# Patient Record
Sex: Female | Born: 1975 | Race: White | Hispanic: No | Smoking: Never smoker
Health system: Southern US, Community
[De-identification: ages and names within clinical notes are randomized; demographics above are authoritative.]

## PROBLEM LIST (undated history)

## (undated) DIAGNOSIS — F32A Depression, unspecified: Secondary | ICD-10-CM

## (undated) HISTORY — PX: WISDOM TOOTH EXTRACTION: SHX21

## (undated) HISTORY — DX: Depression, unspecified: F32.A

---

## 1998-07-18 ENCOUNTER — Encounter: Admission: RE | Admit: 1998-07-18 | Discharge: 1998-07-18 | Payer: Self-pay | Admitting: Family Medicine

## 1998-07-25 ENCOUNTER — Encounter: Admission: RE | Admit: 1998-07-25 | Discharge: 1998-07-25 | Payer: Self-pay | Admitting: Sports Medicine

## 1998-10-08 ENCOUNTER — Emergency Department (HOSPITAL_COMMUNITY): Admission: EM | Admit: 1998-10-08 | Discharge: 1998-10-08 | Payer: Self-pay | Admitting: Emergency Medicine

## 1999-03-06 ENCOUNTER — Emergency Department (HOSPITAL_COMMUNITY): Admission: EM | Admit: 1999-03-06 | Discharge: 1999-03-06 | Payer: Self-pay | Admitting: Emergency Medicine

## 1999-03-28 ENCOUNTER — Encounter: Admission: RE | Admit: 1999-03-28 | Discharge: 1999-03-28 | Payer: Self-pay | Admitting: Sports Medicine

## 1999-03-28 ENCOUNTER — Other Ambulatory Visit: Admission: RE | Admit: 1999-03-28 | Discharge: 1999-03-28 | Payer: Self-pay | Admitting: *Deleted

## 1999-09-15 ENCOUNTER — Encounter: Admission: RE | Admit: 1999-09-15 | Discharge: 1999-09-15 | Payer: Self-pay | Admitting: Family Medicine

## 1999-10-25 ENCOUNTER — Encounter: Payer: Self-pay | Admitting: *Deleted

## 1999-10-25 ENCOUNTER — Emergency Department (HOSPITAL_COMMUNITY): Admission: EM | Admit: 1999-10-25 | Discharge: 1999-10-25 | Payer: Self-pay | Admitting: Emergency Medicine

## 1999-10-27 ENCOUNTER — Encounter: Admission: RE | Admit: 1999-10-27 | Discharge: 1999-10-27 | Payer: Self-pay | Admitting: Family Medicine

## 1999-11-16 ENCOUNTER — Encounter: Admission: RE | Admit: 1999-11-16 | Discharge: 1999-11-16 | Payer: Self-pay | Admitting: Family Medicine

## 2000-03-12 ENCOUNTER — Emergency Department (HOSPITAL_COMMUNITY): Admission: EM | Admit: 2000-03-12 | Discharge: 2000-03-12 | Payer: Self-pay | Admitting: Emergency Medicine

## 2000-05-30 ENCOUNTER — Encounter: Admission: RE | Admit: 2000-05-30 | Discharge: 2000-05-30 | Payer: Self-pay

## 2000-05-30 ENCOUNTER — Encounter: Admission: RE | Admit: 2000-05-30 | Discharge: 2000-05-30 | Payer: Self-pay | Admitting: Family Medicine

## 2001-01-30 ENCOUNTER — Emergency Department (HOSPITAL_COMMUNITY): Admission: EM | Admit: 2001-01-30 | Discharge: 2001-01-31 | Payer: Self-pay | Admitting: Emergency Medicine

## 2001-01-31 ENCOUNTER — Encounter: Payer: Self-pay | Admitting: Emergency Medicine

## 2001-08-21 ENCOUNTER — Emergency Department (HOSPITAL_COMMUNITY): Admission: EM | Admit: 2001-08-21 | Discharge: 2001-08-21 | Payer: Self-pay | Admitting: Emergency Medicine

## 2001-11-07 ENCOUNTER — Emergency Department (HOSPITAL_COMMUNITY): Admission: EM | Admit: 2001-11-07 | Discharge: 2001-11-07 | Payer: Self-pay

## 2001-11-14 ENCOUNTER — Encounter: Admission: RE | Admit: 2001-11-14 | Discharge: 2001-11-14 | Payer: Self-pay | Admitting: Family Medicine

## 2001-11-26 ENCOUNTER — Encounter: Admission: RE | Admit: 2001-11-26 | Discharge: 2001-11-26 | Payer: Self-pay | Admitting: Family Medicine

## 2001-12-12 ENCOUNTER — Encounter: Admission: RE | Admit: 2001-12-12 | Discharge: 2001-12-12 | Payer: Self-pay | Admitting: Family Medicine

## 2002-01-12 ENCOUNTER — Encounter: Admission: RE | Admit: 2002-01-12 | Discharge: 2002-01-12 | Payer: Self-pay | Admitting: Sports Medicine

## 2002-02-10 ENCOUNTER — Encounter: Admission: RE | Admit: 2002-02-10 | Discharge: 2002-02-10 | Payer: Self-pay | Admitting: Family Medicine

## 2002-02-16 ENCOUNTER — Ambulatory Visit (HOSPITAL_COMMUNITY): Admission: RE | Admit: 2002-02-16 | Discharge: 2002-02-16 | Payer: Self-pay | Admitting: Family Medicine

## 2002-03-12 ENCOUNTER — Encounter: Admission: RE | Admit: 2002-03-12 | Discharge: 2002-03-12 | Payer: Self-pay | Admitting: Family Medicine

## 2002-03-16 ENCOUNTER — Encounter: Admission: RE | Admit: 2002-03-16 | Discharge: 2002-03-16 | Payer: Self-pay | Admitting: Nephrology

## 2002-04-13 ENCOUNTER — Encounter: Admission: RE | Admit: 2002-04-13 | Discharge: 2002-04-13 | Payer: Self-pay | Admitting: Family Medicine

## 2002-04-20 ENCOUNTER — Encounter: Admission: RE | Admit: 2002-04-20 | Discharge: 2002-04-20 | Payer: Self-pay | Admitting: Family Medicine

## 2002-04-29 ENCOUNTER — Encounter: Admission: RE | Admit: 2002-04-29 | Discharge: 2002-04-29 | Payer: Self-pay | Admitting: Family Medicine

## 2002-05-07 ENCOUNTER — Ambulatory Visit (HOSPITAL_COMMUNITY): Admission: RE | Admit: 2002-05-07 | Discharge: 2002-05-07 | Payer: Self-pay | Admitting: Family Medicine

## 2002-05-09 ENCOUNTER — Inpatient Hospital Stay (HOSPITAL_COMMUNITY): Admission: AD | Admit: 2002-05-09 | Discharge: 2002-05-09 | Payer: Self-pay | Admitting: *Deleted

## 2002-05-13 ENCOUNTER — Encounter: Admission: RE | Admit: 2002-05-13 | Discharge: 2002-05-13 | Payer: Self-pay | Admitting: Family Medicine

## 2002-05-29 ENCOUNTER — Encounter: Admission: RE | Admit: 2002-05-29 | Discharge: 2002-05-29 | Payer: Self-pay | Admitting: Family Medicine

## 2002-06-11 ENCOUNTER — Encounter: Admission: RE | Admit: 2002-06-11 | Discharge: 2002-06-11 | Payer: Self-pay | Admitting: Family Medicine

## 2002-06-14 ENCOUNTER — Inpatient Hospital Stay (HOSPITAL_COMMUNITY): Admission: AD | Admit: 2002-06-14 | Discharge: 2002-06-14 | Payer: Self-pay | Admitting: Obstetrics and Gynecology

## 2002-06-16 ENCOUNTER — Encounter: Admission: RE | Admit: 2002-06-16 | Discharge: 2002-06-16 | Payer: Self-pay | Admitting: Family Medicine

## 2002-06-18 ENCOUNTER — Inpatient Hospital Stay (HOSPITAL_COMMUNITY): Admission: RE | Admit: 2002-06-18 | Discharge: 2002-06-18 | Payer: Self-pay | Admitting: *Deleted

## 2002-06-19 ENCOUNTER — Inpatient Hospital Stay (HOSPITAL_COMMUNITY): Admission: AD | Admit: 2002-06-19 | Discharge: 2002-06-21 | Payer: Self-pay | Admitting: Obstetrics and Gynecology

## 2002-07-31 ENCOUNTER — Encounter: Admission: RE | Admit: 2002-07-31 | Discharge: 2002-07-31 | Payer: Self-pay | Admitting: Family Medicine

## 2002-07-31 ENCOUNTER — Other Ambulatory Visit: Admission: RE | Admit: 2002-07-31 | Discharge: 2002-07-31 | Payer: Self-pay | Admitting: Family Medicine

## 2002-08-26 ENCOUNTER — Encounter: Admission: RE | Admit: 2002-08-26 | Discharge: 2002-08-26 | Payer: Self-pay | Admitting: Family Medicine

## 2002-09-01 ENCOUNTER — Encounter: Admission: RE | Admit: 2002-09-01 | Discharge: 2002-09-01 | Payer: Self-pay | Admitting: Family Medicine

## 2002-10-12 ENCOUNTER — Encounter: Admission: RE | Admit: 2002-10-12 | Discharge: 2002-10-12 | Payer: Self-pay | Admitting: Sports Medicine

## 2002-11-18 ENCOUNTER — Encounter: Admission: RE | Admit: 2002-11-18 | Discharge: 2002-11-18 | Payer: Self-pay | Admitting: Family Medicine

## 2003-02-19 ENCOUNTER — Encounter: Admission: RE | Admit: 2003-02-19 | Discharge: 2003-02-19 | Payer: Self-pay | Admitting: Family Medicine

## 2003-04-15 ENCOUNTER — Encounter: Admission: RE | Admit: 2003-04-15 | Discharge: 2003-04-15 | Payer: Self-pay | Admitting: Family Medicine

## 2004-03-29 ENCOUNTER — Encounter: Admission: RE | Admit: 2004-03-29 | Discharge: 2004-03-29 | Payer: Self-pay | Admitting: Family Medicine

## 2004-03-29 ENCOUNTER — Other Ambulatory Visit: Admission: RE | Admit: 2004-03-29 | Discharge: 2004-03-29 | Payer: Self-pay | Admitting: Family Medicine

## 2005-07-11 ENCOUNTER — Ambulatory Visit: Payer: Self-pay | Admitting: Sports Medicine

## 2006-01-25 ENCOUNTER — Ambulatory Visit: Payer: Self-pay | Admitting: Sports Medicine

## 2006-03-05 ENCOUNTER — Ambulatory Visit: Payer: Self-pay | Admitting: Family Medicine

## 2006-04-29 ENCOUNTER — Ambulatory Visit: Payer: Self-pay | Admitting: Sports Medicine

## 2006-05-14 ENCOUNTER — Ambulatory Visit: Payer: Self-pay | Admitting: Family Medicine

## 2006-05-16 ENCOUNTER — Ambulatory Visit: Payer: Self-pay | Admitting: Family Medicine

## 2006-06-09 ENCOUNTER — Encounter (INDEPENDENT_AMBULATORY_CARE_PROVIDER_SITE_OTHER): Payer: Self-pay | Admitting: *Deleted

## 2006-06-09 LAB — CONVERTED CEMR LAB

## 2006-06-10 ENCOUNTER — Ambulatory Visit: Payer: Self-pay | Admitting: Family Medicine

## 2006-06-25 ENCOUNTER — Ambulatory Visit: Payer: Self-pay | Admitting: Family Medicine

## 2006-07-24 ENCOUNTER — Ambulatory Visit: Payer: Self-pay | Admitting: Family Medicine

## 2006-10-08 ENCOUNTER — Ambulatory Visit: Payer: Self-pay | Admitting: Family Medicine

## 2006-11-07 ENCOUNTER — Ambulatory Visit: Payer: Self-pay | Admitting: Family Medicine

## 2006-11-25 ENCOUNTER — Emergency Department (HOSPITAL_COMMUNITY): Admission: EM | Admit: 2006-11-25 | Discharge: 2006-11-25 | Payer: Self-pay | Admitting: Emergency Medicine

## 2007-02-06 DIAGNOSIS — J309 Allergic rhinitis, unspecified: Secondary | ICD-10-CM | POA: Insufficient documentation

## 2007-02-06 DIAGNOSIS — G43909 Migraine, unspecified, not intractable, without status migrainosus: Secondary | ICD-10-CM | POA: Insufficient documentation

## 2007-02-07 ENCOUNTER — Encounter (INDEPENDENT_AMBULATORY_CARE_PROVIDER_SITE_OTHER): Payer: Self-pay | Admitting: *Deleted

## 2007-04-23 ENCOUNTER — Encounter: Payer: Self-pay | Admitting: *Deleted

## 2007-04-23 ENCOUNTER — Telehealth: Payer: Self-pay | Admitting: *Deleted

## 2007-05-02 ENCOUNTER — Ambulatory Visit: Payer: Self-pay | Admitting: Family Medicine

## 2007-05-02 ENCOUNTER — Encounter: Payer: Self-pay | Admitting: Family Medicine

## 2007-05-02 DIAGNOSIS — M25569 Pain in unspecified knee: Secondary | ICD-10-CM | POA: Insufficient documentation

## 2007-05-02 LAB — CONVERTED CEMR LAB
HCT: 39.1 %
MCV: 93.3 fL
RBC: 4.19 M/uL
WBC: 7.3 10*3/uL

## 2007-05-28 ENCOUNTER — Encounter: Payer: Self-pay | Admitting: Family Medicine

## 2007-05-28 LAB — CONVERTED CEMR LAB
AST: 14 units/L (ref 0–37)
Alkaline Phosphatase: 66 units/L (ref 39–117)
BUN: 11 mg/dL (ref 6–23)
Glucose, Bld: 76 mg/dL (ref 70–99)
HDL: 52 mg/dL (ref >39)
LDL Cholesterol: 75 mg/dL (ref 0–99)
Total Bilirubin: 0.6 mg/dL (ref 0.3–1.2)
Total CHOL/HDL Ratio: 2.8
Triglycerides: 93 mg/dL (ref <150)
VLDL: 19 mg/dL (ref 0–40)

## 2007-06-04 ENCOUNTER — Encounter: Payer: Self-pay | Admitting: Family Medicine

## 2007-06-04 ENCOUNTER — Ambulatory Visit: Payer: Self-pay | Admitting: Family Medicine

## 2007-06-04 LAB — CONVERTED CEMR LAB
Basophils Absolute: 0.2 10*3/uL
Granulocyte percent: 62.7 %
HCT: 41.9 %
Hemoglobin: 14.4 g/dL
Lymphocytes Relative: 32.8 %
Monocytes Relative: 4.5 %
Nitrite: NEGATIVE
Platelets: 212 10*3/uL
Urobilinogen, UA: 1
WBC Urine, dipstick: NEGATIVE

## 2007-06-20 ENCOUNTER — Telehealth (INDEPENDENT_AMBULATORY_CARE_PROVIDER_SITE_OTHER): Payer: Self-pay | Admitting: *Deleted

## 2007-06-23 ENCOUNTER — Telehealth (INDEPENDENT_AMBULATORY_CARE_PROVIDER_SITE_OTHER): Payer: Self-pay | Admitting: *Deleted

## 2007-10-16 ENCOUNTER — Encounter: Payer: Self-pay | Admitting: Family Medicine

## 2007-10-16 ENCOUNTER — Ambulatory Visit: Payer: Self-pay | Admitting: Family Medicine

## 2007-10-16 ENCOUNTER — Other Ambulatory Visit: Admission: RE | Admit: 2007-10-16 | Discharge: 2007-10-16 | Payer: Self-pay | Admitting: Family Medicine

## 2007-10-16 DIAGNOSIS — F4321 Adjustment disorder with depressed mood: Secondary | ICD-10-CM | POA: Insufficient documentation

## 2007-10-27 ENCOUNTER — Encounter: Payer: Self-pay | Admitting: Family Medicine

## 2007-10-27 ENCOUNTER — Ambulatory Visit: Payer: Self-pay | Admitting: Family Medicine

## 2007-11-17 ENCOUNTER — Telehealth: Payer: Self-pay | Admitting: *Deleted

## 2007-11-25 ENCOUNTER — Telehealth: Payer: Self-pay | Admitting: Family Medicine

## 2007-12-08 ENCOUNTER — Ambulatory Visit: Payer: Self-pay | Admitting: Sports Medicine

## 2007-12-08 LAB — CONVERTED CEMR LAB: Rapid Strep: NEGATIVE

## 2008-01-21 ENCOUNTER — Ambulatory Visit: Payer: Self-pay | Admitting: Family Medicine

## 2008-01-21 DIAGNOSIS — F329 Major depressive disorder, single episode, unspecified: Secondary | ICD-10-CM

## 2008-01-21 DIAGNOSIS — F419 Anxiety disorder, unspecified: Secondary | ICD-10-CM

## 2008-01-21 DIAGNOSIS — F32A Depression, unspecified: Secondary | ICD-10-CM | POA: Insufficient documentation

## 2008-02-10 ENCOUNTER — Ambulatory Visit: Payer: Self-pay | Admitting: Family Medicine

## 2008-02-16 ENCOUNTER — Ambulatory Visit: Payer: Self-pay | Admitting: Family Medicine

## 2008-02-16 ENCOUNTER — Encounter (INDEPENDENT_AMBULATORY_CARE_PROVIDER_SITE_OTHER): Payer: Self-pay | Admitting: Family Medicine

## 2008-02-19 ENCOUNTER — Telehealth: Payer: Self-pay | Admitting: *Deleted

## 2008-02-20 ENCOUNTER — Encounter: Payer: Self-pay | Admitting: *Deleted

## 2008-03-09 ENCOUNTER — Encounter (INDEPENDENT_AMBULATORY_CARE_PROVIDER_SITE_OTHER): Payer: Self-pay | Admitting: Family Medicine

## 2008-03-09 ENCOUNTER — Ambulatory Visit: Payer: Self-pay | Admitting: Family Medicine

## 2008-03-16 ENCOUNTER — Encounter: Payer: Self-pay | Admitting: Family Medicine

## 2008-03-16 ENCOUNTER — Ambulatory Visit: Payer: Self-pay | Admitting: Family Medicine

## 2008-03-16 LAB — CONVERTED CEMR LAB
Chlamydia, DNA Probe: NEGATIVE
GC Probe Amp, Genital: NEGATIVE

## 2008-03-17 ENCOUNTER — Telehealth (INDEPENDENT_AMBULATORY_CARE_PROVIDER_SITE_OTHER): Payer: Self-pay | Admitting: Family Medicine

## 2008-04-07 ENCOUNTER — Emergency Department (HOSPITAL_COMMUNITY): Admission: EM | Admit: 2008-04-07 | Discharge: 2008-04-08 | Payer: Self-pay | Admitting: Emergency Medicine

## 2008-04-15 ENCOUNTER — Encounter: Payer: Self-pay | Admitting: *Deleted

## 2008-05-28 ENCOUNTER — Encounter: Payer: Self-pay | Admitting: Family Medicine

## 2008-05-28 ENCOUNTER — Telehealth: Payer: Self-pay | Admitting: *Deleted

## 2008-05-28 ENCOUNTER — Ambulatory Visit: Payer: Self-pay | Admitting: Family Medicine

## 2008-05-28 LAB — CONVERTED CEMR LAB: GC Probe Amp, Genital: NEGATIVE

## 2008-07-06 ENCOUNTER — Ambulatory Visit: Payer: Self-pay | Admitting: Family Medicine

## 2008-07-27 ENCOUNTER — Encounter: Payer: Self-pay | Admitting: Family Medicine

## 2008-07-27 ENCOUNTER — Ambulatory Visit: Payer: Self-pay | Admitting: Family Medicine

## 2008-07-27 DIAGNOSIS — N643 Galactorrhea not associated with childbirth: Secondary | ICD-10-CM | POA: Insufficient documentation

## 2008-07-27 LAB — CONVERTED CEMR LAB
Chlamydia, DNA Probe: NEGATIVE
GC Probe Amp, Genital: NEGATIVE
Hemoglobin: 14.3 g/dL (ref 12.0–15.0)
MCHC: 33 g/dL (ref 30.0–36.0)
Prolactin: 7.3 ng/mL
RBC: 4.51 M/uL (ref 3.87–5.11)

## 2008-08-04 ENCOUNTER — Encounter (INDEPENDENT_AMBULATORY_CARE_PROVIDER_SITE_OTHER): Payer: Self-pay | Admitting: *Deleted

## 2008-08-06 ENCOUNTER — Encounter: Payer: Self-pay | Admitting: *Deleted

## 2008-08-11 ENCOUNTER — Encounter: Payer: Self-pay | Admitting: *Deleted

## 2008-08-20 ENCOUNTER — Telehealth: Payer: Self-pay | Admitting: *Deleted

## 2008-08-20 ENCOUNTER — Encounter: Admission: RE | Admit: 2008-08-20 | Discharge: 2008-08-20 | Payer: Self-pay | Admitting: Family Medicine

## 2008-09-02 ENCOUNTER — Ambulatory Visit: Payer: Self-pay | Admitting: Family Medicine

## 2008-09-02 ENCOUNTER — Telehealth: Payer: Self-pay | Admitting: *Deleted

## 2008-09-02 LAB — CONVERTED CEMR LAB
Blood in Urine, dipstick: NEGATIVE
Glucose, Urine, Semiquant: NEGATIVE
Specific Gravity, Urine: 1.025
pH: 6.5

## 2008-09-03 ENCOUNTER — Encounter: Payer: Self-pay | Admitting: Family Medicine

## 2008-09-13 ENCOUNTER — Telehealth: Payer: Self-pay | Admitting: *Deleted

## 2008-09-13 ENCOUNTER — Encounter: Payer: Self-pay | Admitting: *Deleted

## 2009-11-09 ENCOUNTER — Emergency Department (HOSPITAL_COMMUNITY): Admission: EM | Admit: 2009-11-09 | Discharge: 2009-11-09 | Payer: Self-pay | Admitting: Family Medicine

## 2009-11-14 ENCOUNTER — Emergency Department (HOSPITAL_COMMUNITY): Admission: EM | Admit: 2009-11-14 | Discharge: 2009-11-14 | Payer: Self-pay | Admitting: Family Medicine

## 2010-07-19 ENCOUNTER — Ambulatory Visit: Payer: Self-pay | Admitting: Family Medicine

## 2010-07-19 ENCOUNTER — Encounter: Payer: Self-pay | Admitting: Family Medicine

## 2010-07-19 LAB — CONVERTED CEMR LAB
Chlamydia, DNA Probe: NEGATIVE
GC Probe Amp, Genital: NEGATIVE

## 2010-08-17 ENCOUNTER — Ambulatory Visit: Payer: Self-pay | Admitting: Family Medicine

## 2010-09-04 ENCOUNTER — Telehealth: Payer: Self-pay | Admitting: *Deleted

## 2010-09-04 ENCOUNTER — Emergency Department (HOSPITAL_COMMUNITY): Admission: EM | Admit: 2010-09-04 | Discharge: 2010-09-04 | Payer: Self-pay | Admitting: Emergency Medicine

## 2010-11-30 ENCOUNTER — Encounter: Payer: Self-pay | Admitting: Family Medicine

## 2010-12-07 ENCOUNTER — Ambulatory Visit: Admission: RE | Admit: 2010-12-07 | Discharge: 2010-12-07 | Payer: Self-pay | Source: Home / Self Care

## 2010-12-07 DIAGNOSIS — J0191 Acute recurrent sinusitis, unspecified: Secondary | ICD-10-CM | POA: Insufficient documentation

## 2011-01-09 NOTE — Assessment & Plan Note (Signed)
Summary: cpe,df   Vital Signs:  Patient profile:   35 year old female Weight:      123.5 pounds Temp:     98.1 degrees F oral Pulse rate:   61 / minute Pulse rhythm:   regular BP sitting:   110 / 75  (left arm) Cuff size:   regular  Vitals Entered By: Loralee Pacas CMA (July 19, 2010 2:35 PM) CC: cpe, Depression Is Patient Diabetic? No Pain Assessment Patient in pain? no        Primary Care Provider:  . [facility]  CC:  cpe and Depression.  History of Present Illness: Pt is here for adult physical. She states that she had a normal PAP, "borderline" thyroid, and tetanus shot 07/28/2009 at [facility].  They also told her that they "felt fluid" in her thyroid but did not perform any scans, aspirates, or ulatrasounds. Today, she complains of chronic fatigue, which has been going on for a "long time." She states that she has been checked for anemia and for her thyroid, but has only been borderline and never had a true abnormal lab value. She does not have any complaints of weight gain or loss, heat or cold intolerance, feeling shakey or jittery, polyuria, or polydipsia. She is not currently on any antidepressants, although she admits to having "a lot" going on. She has never tried therapy or counseling. She does not like being on medicine and would like to try to stay off of antidepressants for now, but recognizes that we are available if she feels like she needs them. She is also interested in finding a new birth control pill that will give her more energy.  She has been with the same one partner since her last PAP smear and believes that he is monogamous. She has a history of bacterial vaginosis (last treated 3-69mo ago) and feels like she has a current infection due to increased discharge and a foul smelling odor to the d/c. She does not smoke, drink, or do any drugs.   Depression History:      Positive alarm features for depression include fatigue (loss of energy).   However, she denies significant weight loss, significant weight gain, hypersomnia, psychomotor agitation, psychomotor retardation, impaired concentration (indecisiveness), and recurrent thoughts of death or suicide.  The patient denies symptoms of a manic disorder including less need for sleep and talkative or feels need to keep talking.         Depression Treatment History:  Prior Medication Used:   Start Date: Assessment of Effect:   Comments:  Zoloft (sertraline)     --     side effects enough to stop   sexual dysfunction Celexa (citalopram)     10/16/2007   much improvement     --   Habits & Providers  Alcohol-Tobacco-Diet     Alcohol drinks/day: 0     Tobacco Status: never  Exercise-Depression-Behavior     Have you felt down or hopeless? yes     Have you felt little pleasure in things? yes     Depression Counseling: not indicated; screening negative for depression     STD Risk: past     Contraception Counseling: questions answered     Drug Use: never     Seat Belt Use: always  Current Problems (verified): 1)  Screening For Malignant Neoplasm of The Cervix  (ICD-V76.2) 2)  Candidiasis, Vaginal  (ICD-112.1) 3)  Vaginal Discharge  (ICD-623.5) 4)  Dysuria  (ICD-788.1) 5)  Fatigue  (ICD-780.79) 6)  Galactorrhea  (ICD-611.6) 7)  Depression  (ICD-311) 8)  Family Stress  (ICD-V61.9) 9)  Adjustment Disorder With Depressed Mood  (ICD-309.0) 10)  Oral Contraception  (ICD-V25.41) 11)  Knee Pain, Chronic  (ICD-719.46) 12)  Rhinitis, Allergic  (ICD-477.9) 13)  Migraine, Unspec., w/o Intractable Migraine  (ICD-346.90) 14)  Anxiety  (ICD-300.00)  Current Medications (verified): 1)  Ortho-Novum 7/7/7 (28) 0.5/0.75/1-35 Mg-Mcg Tabs (Norethin-Eth Estrad Triphasic) .... Take 1 Tablet By Mouth Once A Day 2)  Thumb Keeper Splint .... Right Hand 3)  Ibuprofen 600 Mg  Tabs (Ibuprofen) .Marland Kitchen.. 1 Tab By Mouth Qid As Needed For Pain  Allergies (verified): No Known Drug Allergies  Social  History: STD Risk:  past Drug Use:  never Seat Belt Use:  always  Physical Exam  General:  alert, well-developed, well-nourished, and well-hydrated.   Eyes:  vision grossly intact, pupils equal, pupils round, and pupils reactive to light.   Mouth:  good dentition, pharynx pink and moist, and no erythema.   Neck:  supple, full ROM, no masses, no thyromegaly, and no thyroid nodules or tenderness.   Breasts:  no masses and no nipple discharge.   Lungs:  normal respiratory effort, normal breath sounds, no crackles, and no wheezes.   Heart:  normal rate, regular rhythm, and no murmur.   Abdomen:  soft, non-tender, normal bowel sounds, no distention, and no masses.   Genitalia:  normal introitus, mucosa pink and moist, no vaginal or cervical lesions, normal uterus size and position, and vaginal discharge- white, mucous-like.   Msk:  normal ROM.   Neurologic:  alert & oriented X3.   Psych:  Oriented X3, memory intact for recent and remote, normally interactive, good eye contact, not agitated, not suicidal, not homicidal, and flat affect.     Impression & Recommendations:  Problem # 1:  VAGINAL DISCHARGE (ICD-623.5)  Will f/u wet prep. If BV, [facility] [address]   [phone]/Chlamydia-[facility] (87591/87491) [facility]- Est  Level 4 (60454)  Problem # 2:  FATIGUE (ICD-780.79)  No need to recheck thyroid today since levels in 2008 and 2009 were normal and no mass or goiter was felt on PE.  Suggested that fatigue may be due to high levels of stress and recommended counseling.   Orders: [facility]- Est  Level 4 (09811)  Problem # 3:  DEPRESSION (ICD-311) Assessment: Unchanged  D/w patient. Pt does not feel she needs to go back on medication. Feels that she is just under stress with a lot going on. I gave her the phone numbers for [facility] the [place] and the [facility] since she will be self-pay. Pt appeared receptive.   The  following medications were removed from the medication list:    Celexa 40 Mg Tabs (Citalopram hydrobromide) .Marland Kitchen... Take half tablet daily by mouth  Orders: [facility]- Est  Level 4 (99214)  Problem # 4:  ORAL CONTRACEPTION (ICD-V25.41) Assessment: Unchanged  Pt asked for OCP that will give her more energy. I explained to the pt that no such pill exists and she was amenable to stay on her current OCP, which she likes. She was given a refill for OrthoNovum.   Orders: [facility]- Est  Level 4 (91478)  Complete Medication List: 1)  Ortho-novum 7/7/7 (28) 0.5/0.75/1-35 Mg-mcg Tabs (Norethin-eth estrad triphasic) .... Take 1 tablet by mouth once a day 2)  Thumb Keeper Splint  .... Right hand 3)  Ibuprofen  600 Mg Tabs (Ibuprofen) .Marland Kitchen.. 1 tab by mouth qid as needed for pain  Other Orders: Pap Smear-[facility] ([id number])  Patient Instructions: 1)  We will let you know if any of the tests that we did today are abnormal. 2)  I think that the most common cause for your fatigue and tiredness is stress. Please consider making an appt to see a therapist at either [facility] the [place] (phone #[phone]) or the [facility] (phone #[phone]). I think that you could benefit from this and it would be another step towards staying off of medicines. 3)  We did not do a pap smear because you had one last year that was normal. We can do one next year (07/2011). 4)  It was great meeting you and please feel free to call or make an appointment if you have anything that you need Prescriptions: ORTHO-NOVUM 7/7/7 (28) 0.5/0.75/1-35 MG-MCG TABS (NORETHIN-ETH ESTRAD TRIPHASIC) Take 1 tablet by mouth once a day  #1 x 11   Entered and Authorized by:   Demetria Pore MD   Signed by:   Demetria Pore MD on 07/19/2010   Method used:   Print then Give to Patient   RxID:   [id number]   Laboratory Results  Date/Time Received: July 19, 2010 3:20 PM  Date/Time Reported: July 19, 2010 3:38 PM   Wet  [place] Source: vag WBC/hpf: 10-20 Bacteria/hpf: 2+  Rods Clue cells/hpf: none  Negative whiff Yeast/hpf: none Trichomonas/hpf: none Comments: ...............test performed by......Marland KitchenBonnie A. Swaz[place], MLS (ASCP)cm

## 2011-01-09 NOTE — Progress Notes (Signed)
Summary: triage  Phone Note Call from Patient Call back at [phone] [phone]   Caller: Patient Summary of Call: Pt thinks she has a sinus infection and asking to be seen today. Initial call taken by: Clydell Hakim,  September 04, 2010 9:20 AM  Follow-up for Phone Call        "unavailable, plz try call later" Follow-up by: Golden Circle RN,  September 04, 2010 10:11 AM  Additional Follow-up for Phone Call Additional follow up Details #1::        explained that we have no appts left. she will use UC today Additional Follow-up by: Golden Circle RN,  September 04, 2010 11:53 AM

## 2011-01-09 NOTE — Assessment & Plan Note (Signed)
Summary: ?yeast infection/[facility]   Vital Signs:  Patient profile:   35 year old female Height:      63 inches Weight:      123 pounds BMI:     21.87 Pulse rate:   63 / minute BP sitting:   115 / 69  (left arm)  Vitals Entered By: Theresia Lo RN (August 17, 2010 3:40 PM) CC:  ?  yeast  infection, vaginal discharge Is Patient Diabetic? No Pain Assessment Patient in pain? no        Primary Provider:  . [facility]  CC:   ?  yeast  infection and vaginal discharge.  History of Present Illness: CC: ? yeast infection  HPI:  This is a 35 yo female who presents to clinic with ? yeast infection.  She endorses white vaginal discharge, vaginal itching and irritation.  She does c/o some burning upon urination.  Pt recently had STD and wet mount done 07/19/10 which all came back negative, including yeast.  Pt has tried OTC anti-fungal creams with no relief.  Wants Diflucan - pt says it has worked before in the past.     ROS: Denies hematuria.  Denies abdominal pain/cramping.  Denies fever, chills, nausea/vomiting, constipation/diarrhea.    Habits & Providers  Alcohol-Tobacco-Diet     Tobacco Status: never  Current Medications (verified): 1)  Ortho-Novum 7/7/7 (28) 0.5/0.75/1-35 Mg-Mcg Tabs (Norethin-Eth Estrad Triphasic) .... Take 1 Tablet By Mouth Once A Day 2)  Thumb Keeper Splint .... Right Hand 3)  Ibuprofen 600 Mg  Tabs (Ibuprofen) .Marland Kitchen.. 1 Tab By Mouth Qid As Needed For Pain 4)  Diflucan 150 Mg Tabs (Fluconazole) .... Take Once Tablet Today.  Allergies (verified): No Known Drug Allergies  Past History:  Past Medical History: Last updated: 10/16/2007 h/o depression, on Zoloft long term, stopped using for 2 years stating she didn't need anymore.  states zoloft affected sex drive and made her more somnolent.  Family History: Last updated: 10/16/2007 6 Brothers and sisters healthy, DM II, CAD, MI <50y/o : GMMS,  Father healthy,  Mother with COPD. Thyroid  dz. Sister with anxiety, mother who tried to commit suicide.  Social History: Last updated: 01/21/2008 Lives with her 2 children, different paternity.  Both fathers involved with children's life, although neither in regular contact.  Used to work full time as Production designer, theatre/television/film in a Forensic scientist, currently unemployed. Has GED certificate. Does not smoke, admits occasional EtOH no ilicit drugs.  Mother helps her out with day to day things.  Now working 2 jobs. Oldest daughter of 87, 58 yo, lives in [place] with father.  Moved there in January, spends summers with mom.    Family History: Reviewed history from 10/16/2007 and no changes required. 6 Brothers and sisters healthy, DM II, CAD, MI <50y/o : GMMS,  Father healthy,  Mother with COPD. Thyroid dz. Sister with anxiety, mother who tried to commit suicide.  Social History: Reviewed history from 01/21/2008 and no changes required. Lives with her 2 children, different paternity.  Both fathers involved with children's life, although neither in regular contact.  Used to work full time as Production designer, theatre/television/film in a Forensic scientist, currently unemployed. Has GED certificate. Does not smoke, admits occasional EtOH no ilicit drugs.  Mother helps her out with day to day things.  Now working 2 jobs. Oldest daughter of 66, 41 yo, lives in [place] with father.  Moved there in January, spends summers with mom.    Review of Systems  per HPI  Physical Exam  Abdomen:  Bowel sounds positive,abdomen soft and non-tender without masses Genitalia:  no external lesions.  mild vaginal discharge externally.   Impression & Recommendations:  Problem # 1:  CANDIDIASIS, VAGINAL (ICD-112.1) Assessment Unchanged Pt p/w symptoms c/w yeast infection.  Has had episodes in the past that were similar to this.  Diflucan was the only tx that helped.  Will give one dose of Diflucan today with one refill.  Advised pt to take one dose today.  If not better in 3-4 weeks, to call her PCP.  May need  to take a second dose.  If patient's symptoms still do not improve or if pt develops fever, hematuria, abdominal pain, she will need to RTC for further evaulation.  Will defer wet mount today due to negative wet mount and STD testing last month.  If patient's ? yeast infection does not improve, may need to do another wet mount.  Pt agreed and understood the plan.    Orders: [facility]- Est Level  3 (16109)  Her updated medication list for this problem includes:    Diflucan 150 Mg Tabs (Fluconazole) .Marland Kitchen... Take once tablet today.  Complete Medication List: 1)  Ortho-novum 7/7/7 (28) 0.5/0.75/1-35 Mg-mcg Tabs (Norethin-eth estrad triphasic) .... Take 1 tablet by mouth once a day 2)  Thumb Keeper Splint  .... Right hand 3)  Ibuprofen 600 Mg Tabs (Ibuprofen) .Marland Kitchen.. 1 tab by mouth qid as needed for pain 4)  Diflucan 150 Mg Tabs (Fluconazole) .... Take once tablet today.  Patient Instructions: 1)  Please take Rx as directed. 2)  If symptoms do not improve in the next 3-4 weeks, please call your PCP.  May need one more dose. 3)  Please return to clinic if you develop fever over 100.4, blood in urine, worsening symptoms. Prescriptions: DIFLUCAN 150 MG TABS (FLUCONAZOLE) take once tablet today.  #1 x 1   Entered and Authorized by:   Crystalina Stodghill de Lawson Radar  MD   Signed by:   Barnabas Lister  MD on 08/17/2010   Method used:   Print then Give to Patient   RxID:   [id number] DIFLUCAN 150 MG TABS (FLUCONAZOLE) take once tablet today.  #1 x 1   Entered and Authorized by:   Zakyia Gagan de Lawson Radar  MD   Signed by:   Barnabas Lister  MD on 08/17/2010   Method used:   [facility] [address] [phone] (retail[facility]       [address]       [place], [place]  [address]       Ph: [phone] or [phone]       Fax: [phone]   RxID:   [id number]

## 2011-01-11 NOTE — Miscellaneous (Signed)
  Clinical Lists Changes  Problems: Removed problem of SCREENING FOR MALIGNANT NEOPLASM OF THE CERVIX (ICD-V76.2) Removed problem of CANDIDIASIS, VAGINAL (ICD-112.1) Removed problem of VAGINAL DISCHARGE (ICD-623.5) Removed problem of DYSURIA (ICD-788.1) Removed problem of FATIGUE (ICD-780.79) Removed problem of FAMILY STRESS (ICD-V61.9) Removed problem of ORAL CONTRACEPTION (ICD-V25.41)

## 2011-01-11 NOTE — Assessment & Plan Note (Signed)
Summary: sinus infection?/eo   Vital Signs:  Patient profile:   35 year old female Height:      63 inches Weight:      125.6 pounds BMI:     22.33 Temp:     98.2 degrees F oral Pulse rate:   71 / minute BP sitting:   106 / 66  (left arm) Cuff size:   regular  Vitals Entered By: Jimmy Footman, CMA (December 07, 2010 4:20 PM) CC: sinus infection Is Patient Diabetic? No   Primary Care Rendell Thivierge:  . [facility]  CC:  sinus infection.  History of Present Illness:  Symptoms x 2 weeks Sore throat, cough-dry, pressure in head worse on left side, now with green sputum and green nasal discharge, no fever, no chills, no diarrhea, no abd pain,  no pain with biting or chewing , feels pressure across mid face and in ears   Used sinus congestion OTC, takes zyrtec dailly, Taking Excedrin sinus for headaches Last infection was in Sept   Daughter has bronchitis pregnant , about to deliver    No flu shot  Non smoker   Typically has 1 sinus infection a year  Habits & Providers  Alcohol-Tobacco-Diet     Tobacco Status: never  Current Medications (verified): 1)  Ortho-Novum 7/7/7 (28) 0.5/0.75/1-35 Mg-Mcg Tabs (Norethin-Eth Estrad Triphasic) .... Take 1 Tablet By Mouth Once A Day 2)  Thumb Keeper Splint .... Right Hand 3)  Zyrtec Allergy 10 Mg Tabs (Cetirizine Hcl) .Marland Kitchen.. 1 By Mouth Daily As Needed 4)  Augmentin 875-125 Mg Tabs (Amoxicillin-Pot Clavulanate) .Marland Kitchen.. 1 By Mouth Two Times A Day For Sinus Infection  X 10 Days  Allergies (verified): No Known Drug Allergies  Past History:  Past Medical History: Last updated: 10/16/2007 h/o depression, on Zoloft long term, stopped using for 2 years stating she didn't need anymore.  states zoloft affected sex drive and made her more somnolent.  Physical Exam  General:  alert, well-developed, well-nourished, and well-hydrated.   Vital signs noted  Head:  TTP over maxilllary sinus region, no frontal sinus tenderness Eyes:  non  injected,PERRL, no discharge Ears:  TM Clear bilat Nose:  Thick discharge, swollen turbinates Mouth:  pharynx pink and moist.   Neck:  No LAD Lungs:  CTAB Heart:  RRR, no murmur   Impression & Recommendations:  Problem # 1:  SINUSITIS, ACUTE (ICD-461.9) Assessment New Treat based on prolonged symptoms with headache and focal side of pressure. Likley viral and this was reiterated to patient. Start antihistamine, see instructions-nasal saline Her updated medication list for this problem includes:    Augmentin 875-125 Mg Tabs (Amoxicillin-pot clavulanate) .Marland Kitchen... 1 by mouth two times a day for sinus infection  x 10 days  Orders: [facility]- Est Level  3 (81191)  Complete Medication List: 1)  Ortho-novum 7/7/7 (28) 0.5/0.75/1-35 Mg-mcg Tabs (Norethin-eth estrad triphasic) .... Take 1 tablet by mouth once a day 2)  Thumb Keeper Splint  .... Right hand 3)  Zyrtec Allergy 10 Mg Tabs (Cetirizine hcl) .Marland Kitchen.. 1 by mouth daily as needed 4)  Augmentin 875-125 Mg Tabs (Amoxicillin-pot clavulanate) .Marland Kitchen.. 1 by mouth two times a day for sinus infection  x 10 days  Patient Instructions: 1)  Use Robitusun for cough 2)  Take the augmentin for 10 days  3)  Return if you do not improve 4)  Try Nasal Saline for your nose  Prescriptions: AUGMENTIN 875-125 MG TABS (AMOXICILLIN-POT CLAVULANATE) 1 by mouth two times a day for sinus infection  x 10 days  #20 x 0   Entered and Authorized by:   Milinda Antis MD   Signed by:   Milinda Antis MD on 12/07/2010   Method used:   Print then Give to Patient   RxID:   [id number]    Orders Added: 1)  [facility]- Est Level  3 [14782]

## 2011-01-24 ENCOUNTER — Ambulatory Visit: Payer: Self-pay

## 2011-01-24 ENCOUNTER — Ambulatory Visit (INDEPENDENT_AMBULATORY_CARE_PROVIDER_SITE_OTHER): Payer: Self-pay | Admitting: Family Medicine

## 2011-01-24 VITALS — BP 109/69 | HR 67 | Temp 98.2°F | Ht 62.0 in | Wt 121.9 lb

## 2011-01-24 DIAGNOSIS — J019 Acute sinusitis, unspecified: Secondary | ICD-10-CM

## 2011-01-24 MED ORDER — AZITHROMYCIN 500 MG PO TABS: 500.0000 mg | ORAL_TABLET | Freq: Every day | ORAL | Status: AC

## 2011-01-24 NOTE — Patient Instructions (Signed)
You appear to have recurrent sinusitis. Try the Afrin spray for about 3 days at a time but no longer than 3 days in 2 weeks.  Also, look into the [facility].  Finish all 5 day of antibiotics.

## 2011-01-25 NOTE — Assessment & Plan Note (Signed)
Pt appears to have another sinusitis infection. Plan to treat with a new Abx this time, will use Azithtro since she has not had this one lately.  Suggested Afrin for temporary relief (only 3 days in 2 weeks). And considering looking into the [facility].

## 2011-01-25 NOTE — Progress Notes (Signed)
  Subjective:    Patient ID: Cheyenne Bolton, female    DOB: 06/10/1976, 35 y.o.   MRN: [id number]  HPI Pt has had 10 days of nasal congestion, productive cough, HA's, pressure in sinuses, sore throat. She has no thad fevers or chills. No sick contacts. She has trieid Nyquil, Sinus Excedrin, Sudafed and saline drops all without relief.  She takes Loratadine daily. PT does report pain when leaning forward. She has had sinus infections in the past.    Review of Systems Neg except as noted in the HPI.     Objective:   Physical Exam  Constitutional: She appears well-developed and well-nourished.  HENT:  Head: Normocephalic and atraumatic.  Right Ear: External ear normal.  Left Ear: External ear normal.  Mouth/Throat: No oropharyngeal exudate.       No tenderness when pushing on the patients sinuses. Pt does speak with a nasally voice because of congestion.   Eyes: Conjunctivae and EOM are normal. Pupils are equal, round, and reactive to light.  Neck: Normal range of motion. Neck supple. No tracheal deviation present.  Cardiovascular: Normal rate, regular rhythm and normal heart sounds.   Pulmonary/Chest: Effort normal and breath sounds normal. She has no wheezes.  Lymphadenopathy:    She has no cervical adenopathy.          Assessment & Plan:

## 2011-01-31 ENCOUNTER — Encounter: Payer: Self-pay | Admitting: *Deleted

## 2011-02-16 ENCOUNTER — Encounter: Payer: Self-pay | Admitting: *Deleted

## 2011-03-13 LAB — POCT RAPID STREP A (OFFICE): Streptococcus, Group A Screen (Direct): NEGATIVE

## 2011-03-30 ENCOUNTER — Ambulatory Visit (INDEPENDENT_AMBULATORY_CARE_PROVIDER_SITE_OTHER): Payer: Self-pay | Admitting: Family Medicine

## 2011-03-30 ENCOUNTER — Encounter: Payer: Self-pay | Admitting: Family Medicine

## 2011-03-30 VITALS — BP 100/60 | Temp 98.0°F | Ht 62.0 in | Wt 122.0 lb

## 2011-03-30 DIAGNOSIS — B9689 Other specified bacterial agents as the cause of diseases classified elsewhere: Secondary | ICD-10-CM | POA: Insufficient documentation

## 2011-03-30 DIAGNOSIS — A499 Bacterial infection, unspecified: Secondary | ICD-10-CM

## 2011-03-30 DIAGNOSIS — N760 Acute vaginitis: Secondary | ICD-10-CM | POA: Insufficient documentation

## 2011-03-30 LAB — POCT WET PREP (WET MOUNT)

## 2011-03-30 MED ORDER — METRONIDAZOLE 500 MG PO TABS: 500.0000 mg | ORAL_TABLET | Freq: Two times a day (BID) | ORAL | Status: AC

## 2011-03-30 NOTE — Progress Notes (Signed)
  Subjective:    Patient ID: Cheyenne Bolton, female    DOB: [birth year], 35 y.o.   MRN: [id number]  HPI Patient feels certain that she has BV again, this is recurrent.  She has used both the pills and the gel, hates the pills but prefers them.  The gel is also not available at the HD.    Review of Systems  Constitutional: Negative for appetite change and fatigue.  Gastrointestinal: Negative for abdominal pain.  Genitourinary: Positive for vaginal discharge. Negative for dysuria, pelvic pain and dyspareunia.       Objective:   Physical Exam  Constitutional: She appears well-developed and well-nourished.  Genitourinary:       Normal external and internal exam, white cervical/vaginal discharge, + wiff          Assessment & Plan:

## 2011-03-30 NOTE — Assessment & Plan Note (Signed)
Treat with 7 days of oral metronidazole

## 2011-06-20 ENCOUNTER — Encounter: Payer: Self-pay | Admitting: Family Medicine

## 2011-06-28 ENCOUNTER — Encounter: Payer: Self-pay | Admitting: Family Medicine

## 2011-06-28 ENCOUNTER — Other Ambulatory Visit (HOSPITAL_COMMUNITY)
Admission: RE | Admit: 2011-06-28 | Discharge: 2011-06-28 | Disposition: A | Discharge status: Home / Self Care | Payer: Self-pay | Source: Ambulatory Visit | Attending: Family Medicine | Admitting: Family Medicine

## 2011-06-28 ENCOUNTER — Ambulatory Visit (INDEPENDENT_AMBULATORY_CARE_PROVIDER_SITE_OTHER): Payer: Self-pay | Admitting: Family Medicine

## 2011-06-28 VITALS — BP 106/68 | HR 60 | Temp 98.5°F | Ht 62.0 in | Wt 117.8 lb

## 2011-06-28 DIAGNOSIS — Z202 Contact with and (suspected) exposure to infections with a predominantly sexual mode of transmission: Secondary | ICD-10-CM

## 2011-06-28 DIAGNOSIS — F3289 Other specified depressive episodes: Secondary | ICD-10-CM

## 2011-06-28 DIAGNOSIS — Z7189 Other specified counseling: Secondary | ICD-10-CM

## 2011-06-28 DIAGNOSIS — Z124 Encounter for screening for malignant neoplasm of cervix: Secondary | ICD-10-CM

## 2011-06-28 DIAGNOSIS — N760 Acute vaginitis: Secondary | ICD-10-CM

## 2011-06-28 DIAGNOSIS — F329 Major depressive disorder, single episode, unspecified: Secondary | ICD-10-CM

## 2011-06-28 DIAGNOSIS — Z719 Counseling, unspecified: Secondary | ICD-10-CM

## 2011-06-28 DIAGNOSIS — Z01419 Encounter for gynecological examination (general) (routine) without abnormal findings: Secondary | ICD-10-CM | POA: Insufficient documentation

## 2011-06-28 DIAGNOSIS — Z309 Encounter for contraceptive management, unspecified: Secondary | ICD-10-CM

## 2011-06-28 LAB — POCT WET PREP (WET MOUNT): Clue Cells Wet Prep HPF POC: NEGATIVE

## 2011-06-28 LAB — RPR

## 2011-06-28 NOTE — Patient Instructions (Signed)
Return 1 year for regular annual or sooner if needed.

## 2011-06-28 NOTE — Progress Notes (Signed)
  Subjective:    Patient ID: Cheyenne Bolton, female    DOB: [birth year], 35 y.o.   MRN: [id number]  HPI Pap smear- Pt states she is due for pap.  No known history of abnormal.  Possible exposure to STD's- Has 1 sexual partner, heard a rumor that he has been with someone else.  Usually uses condoms but has had sex with partner 1-2 times without protection. Pt asks for std screening. Has not had any vaginal discharge, itching, or foul odor.    Health maintanence- Had tetanus 2 years ago, pap smear today. Denies any breast lesions or bumps.  No nipple discharge.     Review of Systems As per above    Objective:   Physical Exam  Constitutional: She is oriented to person, place, and time. She appears well-developed and well-nourished.  HENT:  Head: Normocephalic and atraumatic.  Neck: Normal range of motion.  Cardiovascular: Normal rate and normal heart sounds.   No murmur heard. Pulmonary/Chest: Effort normal. No respiratory distress. She has no wheezes.  Abdominal: Soft. She exhibits no distension. There is no tenderness. There is no rebound and no guarding.  Genitourinary: Vagina normal and uterus normal. There is no rash or lesion on the right labia. There is no rash or lesion on the left labia. Cervix exhibits no motion tenderness, no discharge and no friability. Right adnexum displays no mass, no tenderness and no fullness. Left adnexum displays no mass, no tenderness and no fullness.  Musculoskeletal: She exhibits no edema.  Neurological: She is alert and oriented to person, place, and time.  Skin: No rash noted.  Psychiatric: She has a normal mood and affect. Her behavior is normal. Judgment and thought content normal.          Assessment & Plan:

## 2011-06-29 ENCOUNTER — Encounter: Payer: Self-pay | Admitting: Family Medicine

## 2011-06-29 LAB — GC/CHLAMYDIA PROBE AMP, GENITAL: GC Probe Amp, Genital: NEGATIVE

## 2011-07-01 DIAGNOSIS — Z309 Encounter for contraceptive management, unspecified: Secondary | ICD-10-CM | POA: Insufficient documentation

## 2011-07-01 DIAGNOSIS — Z719 Counseling, unspecified: Secondary | ICD-10-CM | POA: Insufficient documentation

## 2011-07-01 NOTE — Assessment & Plan Note (Signed)
Pap smear obtained- results pending.

## 2011-07-01 NOTE — Assessment & Plan Note (Signed)
Pt needs refill on bcp but states that health department had to substitute the triphasil with another birth control pill they had on formulary.  Pt to call and let me know what the name of the ocp is that she would like called in to [facility].

## 2011-07-01 NOTE — Assessment & Plan Note (Addendum)
Pap smear obtained, tetanus received in 2010 per pt.  H/o unprotected sex:  GC/Chlam/wet prep/hiv/rpr pending.

## 2011-07-02 ENCOUNTER — Telehealth: Payer: Self-pay | Admitting: Family Medicine

## 2011-07-02 NOTE — Telephone Encounter (Signed)
Has a question about the Birth Control she is on and needs to be sure that she gets the refills for the right medication.  A fax was sent from the Health Department, but they have not gotten a response.

## 2011-07-02 NOTE — Telephone Encounter (Signed)
Called pt. She reports, that the pharmacist has sent a refill request to [facility] for BCP. She does not know the name of it, but Dr.Caviness should know which one it is.  Will fwd. To Dr.Caviness .Arlyss Repress

## 2011-07-03 ENCOUNTER — Other Ambulatory Visit: Payer: Self-pay | Admitting: Family Medicine

## 2011-07-03 MED ORDER — NORGESTIMATE-ETH ESTRADIOL 0.25-35 MG-MCG PO TABS: 1.0000 | ORAL_TABLET | Freq: Every day | ORAL | Status: DC

## 2011-07-03 NOTE — Telephone Encounter (Signed)
Tried to call in to [facility] but no answer.  Rx request form completed and placed in the "to be faxed" pile.

## 2011-07-03 NOTE — Telephone Encounter (Signed)
Refill request is in [place] box.  The pharmacy switched meds and wanted to make sure that Caviness can fill this- she has been on this one for a while.  She needs today

## 2011-09-04 LAB — URINALYSIS, ROUTINE W REFLEX MICROSCOPIC
Bilirubin Urine: NEGATIVE
Hgb urine dipstick: NEGATIVE
Nitrite: NEGATIVE
Specific Gravity, Urine: 1.023
pH: 6

## 2011-09-04 LAB — POCT I-STAT, CHEM 8
BUN: 12
Creatinine, Ser: 0.9
HCT: 42
Potassium: 3.8
Sodium: 143
TCO2: 29

## 2011-09-04 LAB — POCT PREGNANCY, URINE
Operator id: 295131
Preg Test, Ur: NEGATIVE

## 2011-10-19 ENCOUNTER — Encounter: Payer: Self-pay | Admitting: Family Medicine

## 2011-10-19 ENCOUNTER — Ambulatory Visit (INDEPENDENT_AMBULATORY_CARE_PROVIDER_SITE_OTHER): Payer: Self-pay | Admitting: Family Medicine

## 2011-10-19 VITALS — BP 109/71 | HR 69 | Temp 98.2°F | Ht 62.0 in | Wt 121.8 lb

## 2011-10-19 DIAGNOSIS — B9689 Other specified bacterial agents as the cause of diseases classified elsewhere: Secondary | ICD-10-CM

## 2011-10-19 DIAGNOSIS — A499 Bacterial infection, unspecified: Secondary | ICD-10-CM

## 2011-10-19 DIAGNOSIS — N760 Acute vaginitis: Secondary | ICD-10-CM

## 2011-10-19 LAB — POCT WET PREP (WET MOUNT): Yeast Wet Prep HPF POC: NEGATIVE

## 2011-10-19 MED ORDER — METRONIDAZOLE 500 MG PO TABS: 500.0000 mg | ORAL_TABLET | Freq: Three times a day (TID) | ORAL | Status: AC

## 2011-10-19 MED ORDER — FLUCONAZOLE 150 MG PO TABS: 150.0000 mg | ORAL_TABLET | Freq: Once | ORAL | Status: AC

## 2011-10-19 NOTE — Assessment & Plan Note (Signed)
Discussed treatment of partner and how this was not evidence based. She knows to use a condom to avoid recurrence. Will treat BV as physical exam and patient history positive Wet prep/gc/cl pending Diflucan prescription - pt. Always gets yeast infection after flagyl use. Advised not to drink alcohol during flagyl use because of disulfiram reaction.

## 2011-10-19 NOTE — Progress Notes (Signed)
  Subjective:    Cheyenne Bolton is a 35 y.o. female who presents for vaginal discharge and pain as well as sexually transmitted disease check. Sexual history reviewed with the patient. STI Exposure: current sexual partner thought to have history of infection of unknown type. Previous history of yeast infections and recurrent BV with sexual activity. Current symptoms vaginal discharge: white and watery, vaginal irritation: moderate. Contraception: condoms  Review of Systems No fever, chills, night sweats, rash, joint pain, vaginal bleeding, pregnancy, alcohol abuse.   Objective:    BP 109/71  Pulse 69  Temp(Src) 98.2 F (36.8 C) (Oral)  Ht 5\' 2"  (1.575 m)  Wt 121 lb 12.8 oz (55.248 kg)  BMI 22.28 kg/m2 General:   alert and cooperative  Lymph Nodes:   Inguinal adenopathy: none  Pelvis:  External genitalia: normal general appearance Vaginal: normal mucosa without prolapse or lesions and discharge, white Cervix: normal appearance  Cultures:  GC and Chlamydia genprobes and wet prep     Assessment:    Very low risk of STD exposure, suspect BV    Plan:    Discussed safe sexual practice in detail Appropriate educational material was distributed See orders for STD cultures and assays

## 2011-10-19 NOTE — Patient Instructions (Signed)
I will call you if your tests are not good.  Otherwise I will send you a letter.  If you do not hear from me with in 2 weeks please call our office.     It was great to see you today!  * Sexual partners - It is not necessary to treat female sexual partners of affected women, as there is no strong evidence that the woman's response to therapy and risk of relapse are influenced by treatment of her female sex partner(s) [63,98]. However, the available trials are flawed; well-designed and larger trials should be performed to assess the efficacy of female partner treatment [99].  Sexual intercourse appears to play a role in disease activity. Some studies have reported reduced rates of recurrence when female sexual partners used condoms routinely during coitus or when women remained abstinent

## 2011-12-31 ENCOUNTER — Encounter (HOSPITAL_COMMUNITY): Payer: Self-pay | Admitting: *Deleted

## 2011-12-31 ENCOUNTER — Emergency Department (INDEPENDENT_AMBULATORY_CARE_PROVIDER_SITE_OTHER)
Admission: EM | Admit: 2011-12-31 | Discharge: 2011-12-31 | Disposition: A | Discharge status: Home / Self Care | Payer: Worker's Compensation | Source: Home / Self Care | Attending: Emergency Medicine | Admitting: Emergency Medicine

## 2011-12-31 DIAGNOSIS — S39012A Strain of muscle, fascia and tendon of lower back, initial encounter: Secondary | ICD-10-CM

## 2011-12-31 DIAGNOSIS — S335XXA Sprain of ligaments of lumbar spine, initial encounter: Secondary | ICD-10-CM

## 2011-12-31 MED ORDER — ACETAMINOPHEN-CODEINE #3 300-30 MG PO TABS: 1.0000 | ORAL_TABLET | ORAL | Status: AC | PRN

## 2011-12-31 MED ORDER — DICLOFENAC SODIUM 75 MG PO TBEC: 75.0000 mg | DELAYED_RELEASE_TABLET | Freq: Two times a day (BID) | ORAL | Status: DC

## 2011-12-31 MED ORDER — CYCLOBENZAPRINE HCL 5 MG PO TABS: 5.0000 mg | ORAL_TABLET | Freq: Three times a day (TID) | ORAL | Status: AC | PRN

## 2011-12-31 NOTE — ED Provider Notes (Signed)
History     CSN: [id number]  Arrival date & time 12/31/11  1755   First MD Initiated Contact with Patient 12/31/11 1758      Chief Complaint  Patient presents with  . Back Pain    (Consider location/radiation/quality/duration/timing/severity/associated sxs/prior treatment) HPI Comments: Cheyenne Bolton was lifting a 30 pound crate of sodas today at work at around 1:50 PM when she felt a pop in her back and noted instant pain that radiated uppers back and down both legs. Now the pain is settled in her left lower back with radiation up towards her shoulder blade and down into her thigh but not below the knee. She denies any numbness, tingling, weakness, bladder, or bowel complaints. She has not had similar back problems in the past and denies any history of lumbar strain, sprain, or disc problems.  Patient is a 36 y.o. female presenting with back pain.  Back Pain  Pertinent negatives include no fever, no numbness, no abdominal pain, no dysuria and no weakness.    History reviewed. No pertinent past medical history.  History reviewed. No pertinent past surgical history.  History reviewed. No pertinent family history.  History  Substance Use Topics  . Smoking status: Never Smoker   . Smokeless tobacco: Never Used  . Alcohol Use: Yes     rare    OB History    Grav Para Term Preterm Abortions TAB SAB Ect Mult Living                  Review of Systems  Constitutional: Negative for fever, chills and unexpected weight change.  Gastrointestinal: Negative for abdominal pain.  Genitourinary: Negative for dysuria, urgency, frequency and difficulty urinating.  Musculoskeletal: Positive for back pain. Negative for myalgias, joint swelling, arthralgias and gait problem.  Neurological: Negative for weakness and numbness.    Allergies  Review of patient's allergies indicates no known allergies.  Home Medications   Current Outpatient Rx  Name Route Sig Dispense Refill  .  ACETAMINOPHEN-CODEINE #3 300-30 MG PO TABS Oral Take 1-2 tablets by mouth every 4 (four) hours as needed for pain. 30 tablet 0  . CYCLOBENZAPRINE HCL 5 MG PO TABS Oral Take 1 tablet (5 mg total) by mouth 3 (three) times daily as needed for muscle spasms. 30 tablet 0  . DICLOFENAC SODIUM 75 MG PO TBEC Oral Take 1 tablet (75 mg total) by mouth 2 (two) times daily. 20 tablet 0  . LORATADINE 10 MG PO TABS Oral Take 10 mg by mouth daily.      Marland Kitchen NORGESTIMATE-ETH ESTRADIOL 0.25-35 MG-MCG PO TABS Oral Take 1 tablet by mouth daily. 30 tablet 11    BP 105/75  Pulse 84  Temp(Src) 97.8 F (36.6 C) (Oral)  Resp 16  SpO2 100%  LMP 12/11/2011  Physical Exam  Nursing note and vitals reviewed. Constitutional: She is oriented to person, place, and time. She appears well-developed and well-nourished.  Abdominal: Soft. Bowel sounds are normal. She exhibits no distension, no abdominal bruit, no pulsatile midline mass and no mass. There is no tenderness. There is no rebound and no guarding.  Musculoskeletal: She exhibits tenderness. She exhibits no edema.       Lumbar back: She exhibits decreased range of motion, tenderness, bony tenderness and pain. She exhibits no swelling, no edema, no deformity, no spasm and normal pulse.       Exam of the back reveals tenderness to palpation in the left lumbar spine but not over the midline or  on the right. The back has a very limited range of motion with just a few degrees of motion in all directions with pain and spasm. Straight leg raising was positive on the left negative on the right. DTRs were 2+ for both knees and ankles, muscle strength was intact, sensation was normal to light touch.  Neurological: She is alert and oriented to person, place, and time. She has normal reflexes. She displays no atrophy. No sensory deficit. She exhibits normal muscle tone. Coordination and gait normal.  Skin: Skin is warm and dry. No rash noted. She is not diaphoretic.    ED Course    Procedures (including critical care time)  Labs Reviewed - No data to display No results found.   1. Lumbar strain   possibly due to muscle strain or to HNP       MDM  The patient will be placed at limited duty at work, and she was told to followup at the occupational health clinic in 2 days.        Roque Lias, MD 12/31/11 [phone]

## 2011-12-31 NOTE — ED Notes (Signed)
While at work lifted a tray of drinks; while lifting felt sudden "pop" and pain in left lower back radiating down to buttock and posterior thigh @ approx 1350.  C/O difficulty sitting and relaxing muscles due to pain.  Pt grimacing.  Has not taken any measures to help alleviate pain.  Denies any parasthesias.

## 2012-04-10 ENCOUNTER — Telehealth: Payer: Self-pay | Admitting: Family Medicine

## 2012-04-10 ENCOUNTER — Encounter: Payer: Self-pay | Admitting: Family Medicine

## 2012-04-10 ENCOUNTER — Ambulatory Visit (INDEPENDENT_AMBULATORY_CARE_PROVIDER_SITE_OTHER): Payer: Self-pay | Admitting: Family Medicine

## 2012-04-10 VITALS — BP 110/74 | HR 65 | Temp 98.1°F | Ht 62.0 in | Wt 123.2 lb

## 2012-04-10 DIAGNOSIS — J0100 Acute maxillary sinusitis, unspecified: Secondary | ICD-10-CM

## 2012-04-10 DIAGNOSIS — J0191 Acute recurrent sinusitis, unspecified: Secondary | ICD-10-CM

## 2012-04-10 DIAGNOSIS — J019 Acute sinusitis, unspecified: Secondary | ICD-10-CM

## 2012-04-10 MED ORDER — AZITHROMYCIN 250 MG PO TABS: ORAL_TABLET | ORAL | Status: AC

## 2012-04-10 NOTE — Progress Notes (Signed)
  Subjective:     Cheyenne Bolton is a 36 y.o. female who presents for evaluation of sinus pain. Symptoms include: clear rhinorrhea, congestion, facial pain, frequent clearing of the throat, headaches, nasal congestion, post nasal drip, sinus pressure and sniffing. Onset of symptoms was 1 week ago. Symptoms have been gradually worsening since that time. Past history is significant for recurrent sinusitis. Patient is a non-smoker.  The following portions of the patient's history were reviewed and updated as appropriate: allergies, current medications, past medical history and problem list.  Review of Systems Constitutional: negative for fevers Eyes: negative for itching  Respiratory: negative for cough and sputum Allergic/Immunologic: negative for rash   Objective:    General appearance: alert, cooperative and no distress Head: Normocephalic, without obvious abnormality, atraumatic Eyes: conjunctivae/corneas clear. PERRL, EOM's intact. Fundi benign. Ears: abnormal external canal right ear - ceruminosis impacting canal Left TM with good LM and normal LR Nose: no discharge, moderate congestion, sinus tenderness right Throat: lips, mucosa, and tongue normal; teeth and gums normal Neck: no adenopathy, no carotid bruit, no JVD, supple, symmetrical, trachea midline and thyroid not enlarged, symmetric, no tenderness/mass/nodules Lungs: clear to auscultation bilaterally Heart: regular rate and rhythm, S1, S2 normal, no murmur, click, rub or gallop Lymph nodes: Cervical, supraclavicular nodes normal.    Assessment:    Acute bacterial sinusitis.    Plan:    Nasal saline sprays. Azithromycin 500 mg daily for 5 days.

## 2012-04-10 NOTE — Patient Instructions (Signed)
Take the Azithromycin 2 tablets daily for 5 days.  The antibiotic will stay in your system for 10 days.

## 2012-04-10 NOTE — Telephone Encounter (Signed)
Was given abx and she usually gets yeast inf and wants to know if diflucan can be called in  [facility]

## 2012-04-11 NOTE — Telephone Encounter (Signed)
Forwarded to pcp for follow up.Cheyenne Bolton Lynetta  

## 2012-04-14 MED ORDER — FLUCONAZOLE 150 MG PO TABS: 150.0000 mg | ORAL_TABLET | Freq: Once | ORAL | Status: AC

## 2012-04-14 NOTE — Telephone Encounter (Signed)
Addended by: Edd Arbour on: 04/14/2012 11:25 AM   Modules accepted: Orders

## 2012-04-14 NOTE — Telephone Encounter (Signed)
Called in medication for yeast infection

## 2012-07-21 ENCOUNTER — Other Ambulatory Visit: Payer: Self-pay | Admitting: Family Medicine

## 2012-07-21 DIAGNOSIS — Z309 Encounter for contraceptive management, unspecified: Secondary | ICD-10-CM

## 2012-07-21 MED ORDER — NORGESTIM-ETH ESTRAD TRIPHASIC 0.18/0.215/0.25 MG-35 MCG PO TABS: 1.0000 | ORAL_TABLET | Freq: Every day | ORAL | Status: DC

## 2012-07-21 NOTE — Assessment & Plan Note (Signed)
[facility]  Given 11 refills

## 2012-07-23 ENCOUNTER — Ambulatory Visit (INDEPENDENT_AMBULATORY_CARE_PROVIDER_SITE_OTHER): Payer: Self-pay | Admitting: Family Medicine

## 2012-07-23 ENCOUNTER — Encounter: Payer: Self-pay | Admitting: Family Medicine

## 2012-07-23 VITALS — BP 111/74 | HR 56 | Ht 62.0 in | Wt 123.0 lb

## 2012-07-23 DIAGNOSIS — Z13220 Encounter for screening for lipoid disorders: Secondary | ICD-10-CM

## 2012-07-23 DIAGNOSIS — F419 Anxiety disorder, unspecified: Secondary | ICD-10-CM

## 2012-07-23 DIAGNOSIS — Z0000 Encounter for general adult medical examination without abnormal findings: Secondary | ICD-10-CM

## 2012-07-23 DIAGNOSIS — F341 Dysthymic disorder: Secondary | ICD-10-CM

## 2012-07-23 DIAGNOSIS — Z833 Family history of diabetes mellitus: Secondary | ICD-10-CM

## 2012-07-23 DIAGNOSIS — F32A Depression, unspecified: Secondary | ICD-10-CM

## 2012-07-23 DIAGNOSIS — R252 Cramp and spasm: Secondary | ICD-10-CM

## 2012-07-23 NOTE — Patient Instructions (Addendum)
Make appointment for fasting bloodwork  Take a daily multivitamin  Daily exercise and stretching can help leg cramps  I will refill your birth control for 1 year  Follow-up annually or sooner if needed

## 2012-07-24 DIAGNOSIS — R252 Cramp and spasm: Secondary | ICD-10-CM | POA: Insufficient documentation

## 2012-07-24 NOTE — Addendum Note (Signed)
Addended by: Macy Mis on: 07/24/2012 08:42 AM   Modules accepted: Orders

## 2012-07-24 NOTE — Assessment & Plan Note (Addendum)
intermittent  leg cramps, not acute, no swelling.  Resolves with massage.  Advised adding daily physical activity, gentle stretching, stay well hydrated.  Will check electrolytes and TSH

## 2012-07-24 NOTE — Progress Notes (Signed)
  Subjective:    Patient ID: Cheyenne Bolton, female    DOB: 13-May-1976, 36 y.o.   MRN: [id number]  HPI Here for annual physical.  Needs refill on OCP.  Annual Gynecological Exam   Wt Readings from Last 3 Encounters:  07/23/12 123 lb (55.792 kg)  04/10/12 123 lb 3 oz (55.877 kg)  10/19/11 121 lb 12.8 oz (55.248 kg)   Last period:   Regular periods: yes Heavy bleeding: no  Sexually active: yes Birth control or hormonal therapy: Hx of STD: Patient desires STD screening Dyspareunia: No Vaginal discharge: no Dysuria:No Breast mass or concerns: No Last Pap: 2012  History of abnormal pap: No   FH of breast, uterine, ovarian, colon cancer: No      Review of Systems    Patient Information Form: Screening and ROS  AUDIT-C Score: 0 Do you feel safe in relationships? yes PHQ-2:negative  Review of Symptoms  General:  Negative for nexplained weight loss, fever Skin: Negative for new or changing mole, sore that won't heal HEENT: Negative for trouble hearing, trouble seeing, ringing in ears, mouth sores, hoarseness, change in voice, dysphagia. CV:  Negative for chest pain, dyspnea, edema, palpitations Resp: Negative for cough, dyspnea, hemoptysis GI: Negative for nausea, vomiting, diarrhea, constipation, abdominal pain, melena, hematochezia. GU: Negative for dysuria, incontinence, urinary hesitance, hematuria, vaginal or penile discharge, polyuria, sexual difficulty, lumps in testicle or breasts MSK: Negative for muscle cramps or aches, joint pain or swelling Neuro: Negative for headaches, weakness, numbness, dizziness, passing out/fainting Psych: Negative for depression, anxiety, memory problems   Objective:   Physical Exam GEN: Alert & Oriented, No acute distress CV:  Regular Rate & Rhythm, no murmur Respiratory:  Normal work of breathing, CTAB Abd:  + BS, soft, no tenderness to palpation Ext: no pre-tibial edema Gyn: patient deferred.       Assessment & Plan:    Vaccinations UTD. Screening UTD Healthy weight, BP. Will refil OCP. Fasting labwork for strong family history of DM. Follow-up annually

## 2012-07-29 ENCOUNTER — Telehealth: Payer: Self-pay | Admitting: Family Medicine

## 2012-07-29 DIAGNOSIS — Z205 Contact with and (suspected) exposure to viral hepatitis: Secondary | ICD-10-CM

## 2012-07-29 NOTE — Telephone Encounter (Signed)
Pt is asking to speak with nurse about an important test she needs to have done - already coming in for labs on Thurs and wants to know if she can have this lab added

## 2012-07-29 NOTE — Telephone Encounter (Signed)
Pt stated that she would like to have testing done for Hepatitis. Her boyfriend is incarcerated and had some blood work done and was told that he has Hepatitis. Will forward to Dr. Earnest Bailey for advice.[facility]

## 2012-07-30 ENCOUNTER — Other Ambulatory Visit: Payer: Self-pay

## 2012-07-31 ENCOUNTER — Other Ambulatory Visit: Payer: Self-pay

## 2012-07-31 DIAGNOSIS — Z205 Contact with and (suspected) exposure to viral hepatitis: Secondary | ICD-10-CM | POA: Insufficient documentation

## 2012-07-31 NOTE — Telephone Encounter (Signed)
Will place order for hepatitis panel and hep b antibody

## 2012-08-06 ENCOUNTER — Other Ambulatory Visit: Payer: Self-pay

## 2012-09-04 ENCOUNTER — Other Ambulatory Visit: Payer: Self-pay

## 2012-09-04 DIAGNOSIS — Z205 Contact with and (suspected) exposure to viral hepatitis: Secondary | ICD-10-CM

## 2012-09-04 DIAGNOSIS — R252 Cramp and spasm: Secondary | ICD-10-CM

## 2012-09-04 DIAGNOSIS — Z13220 Encounter for screening for lipoid disorders: Secondary | ICD-10-CM

## 2012-09-04 DIAGNOSIS — Z833 Family history of diabetes mellitus: Secondary | ICD-10-CM

## 2012-09-04 LAB — COMPREHENSIVE METABOLIC PANEL
ALT: 10 U/L (ref 0–35)
AST: 12 U/L (ref 0–37)
CO2: 26 mEq/L (ref 19–32)
Chloride: 106 mEq/L (ref 96–112)
Creat: 0.7 mg/dL (ref 0.50–1.10)
Sodium: 137 mEq/L (ref 135–145)
Total Bilirubin: 0.6 mg/dL (ref 0.3–1.2)
Total Protein: 6.3 g/dL (ref 6.0–8.3)

## 2012-09-04 LAB — LIPID PANEL
LDL Cholesterol: 47 mg/dL (ref 0–99)
VLDL: 12 mg/dL (ref 0–40)

## 2012-09-04 LAB — TSH: TSH: 1.002 u[IU]/mL (ref 0.350–4.500)

## 2012-09-04 NOTE — Progress Notes (Signed)
CMP,FLP,TSH,HBsAb AND HEP PANEL DONE TODAY Cheyenne Bolton

## 2012-09-05 LAB — HEPATITIS PANEL, ACUTE
HCV Ab: NEGATIVE
Hep A IgM: NEGATIVE

## 2012-09-08 ENCOUNTER — Encounter: Payer: Self-pay | Admitting: Family Medicine

## 2012-09-16 ENCOUNTER — Encounter: Payer: Self-pay | Admitting: Family Medicine

## 2012-09-16 ENCOUNTER — Ambulatory Visit (INDEPENDENT_AMBULATORY_CARE_PROVIDER_SITE_OTHER): Payer: Self-pay | Admitting: Family Medicine

## 2012-09-16 VITALS — BP 112/73 | HR 80 | Temp 98.3°F | Wt 125.0 lb

## 2012-09-16 DIAGNOSIS — L259 Unspecified contact dermatitis, unspecified cause: Secondary | ICD-10-CM

## 2012-09-16 DIAGNOSIS — L309 Dermatitis, unspecified: Secondary | ICD-10-CM | POA: Insufficient documentation

## 2012-09-16 MED ORDER — TRIAMCINOLONE ACETONIDE 0.5 % EX OINT: TOPICAL_OINTMENT | Freq: Two times a day (BID) | CUTANEOUS | Status: DC

## 2012-09-16 MED ORDER — DIPHENHYDRAMINE HCL 25 MG PO TABS: 25.0000 mg | ORAL_TABLET | Freq: Every evening | ORAL | Status: DC | PRN

## 2012-09-16 NOTE — Assessment & Plan Note (Signed)
A: eczematous eruption, unclear trigger P: Please apply kenalog twice daily for up to two weeks.  Take a nightly benadryl for itching.  Keep skin well moisturized: brands like cerave and eucerin are excellent options. Generic is just as good as brand name.

## 2012-09-16 NOTE — Patient Instructions (Addendum)
Cheyenne Bolton,  Thank you for coming in today.  Please apply kenalog twice daily for up to two weeks.  Take a nightly benadryl for itching.  Keep skin well moisturized: brands like cerave and eucerin are excellent options. Generic is just as good as brand name.   If rash worsens please return for re-evaluation.  Dr. Armen Pickup

## 2012-09-16 NOTE — Progress Notes (Signed)
Subjective:     Patient ID: Cheyenne Bolton, female   DOB: March 17, 1976, 36 y.o.   MRN: [id number]  HPI 36 yo F presents for evaluation of rash x 10 days. Rash started in R antecubital fossa. It is now in L antecubital fossa, posterior neck, under breast and in inguinal area.  She does shave her pubic hair and has had a similar yet milder rash in this area in the past. No history of similar symptoms previously. No new skin care products or food. Using baby oil moisturizer and Dove soap. Daughter with eczema. She tried her daughter's hydrocortisone cream without relief in symptoms. She takes a daily Claritin for allergic rhinitis.   Review of Systems As per HPI     Objective:   Physical Exam BP 112/73  Pulse 80  Temp 98.3 F (36.8 C) (Oral)  Wt 125 lb (56.7 kg)  LMP 08/26/2012 General appearance: alert, cooperative and no distress Skin: erythematous lichenified patches in bilateral antecubital fossa, posterior neck, under breast. Mild erythema in bilateral inguinal area R>L     Assessment and Plan:

## 2014-04-27 ENCOUNTER — Telehealth: Payer: Self-pay | Admitting: Family Medicine

## 2014-04-27 NOTE — Telephone Encounter (Signed)
Wants to know if she was tested for HPV virus. Please advise

## 2014-04-27 NOTE — Telephone Encounter (Signed)
Informed patient that her last pap in 06/2011 came back normal.  States [place] showing [place] was positive for HPV.  Pt will bring a copy of record for our office since she is coming back to be seen here soon.   She is uncertain of when this might have happened.  I informed her to ask her MD who did pap more questions or she could read about it and get informed that way.  Pt voiced understanding. Brylin Stopper,CMA

## 2014-04-28 ENCOUNTER — Telehealth: Payer: Self-pay | Admitting: Family Medicine

## 2014-04-28 NOTE — Telephone Encounter (Signed)
Want to discuss results of a test she tested positive for.  Please call back asap

## 2014-04-28 NOTE — Telephone Encounter (Signed)
Spoke with patient and she would like to know how do men test for HPV.  Please see previous telephone note.  Please call to discuss with her if possible. Jazmin Hartsell,CMA

## 2014-04-29 NOTE — Telephone Encounter (Addendum)
Please advise patient currently, there is no approved HPV test recommended for men. if she has a man in mind that needed to get tested he should see his PCP for further recommendations.

## 2014-04-29 NOTE — Telephone Encounter (Signed)
Spoke with patient and she is aware of this. Jazmin Hartsell,CMA  

## 2014-08-17 ENCOUNTER — Ambulatory Visit: Payer: Self-pay

## 2015-01-25 ENCOUNTER — Encounter: Payer: Self-pay | Admitting: Family Medicine

## 2015-01-25 ENCOUNTER — Other Ambulatory Visit (HOSPITAL_COMMUNITY)
Admission: RE | Admit: 2015-01-25 | Discharge: 2015-01-25 | Disposition: A | Discharge status: Home / Self Care | Payer: Self-pay | Source: Ambulatory Visit | Attending: Family Medicine | Admitting: Family Medicine

## 2015-01-25 ENCOUNTER — Ambulatory Visit (INDEPENDENT_AMBULATORY_CARE_PROVIDER_SITE_OTHER): Payer: Self-pay | Admitting: Family Medicine

## 2015-01-25 VITALS — BP 109/62 | HR 60 | Temp 98.8°F | Wt 124.0 lb

## 2015-01-25 DIAGNOSIS — Z124 Encounter for screening for malignant neoplasm of cervix: Secondary | ICD-10-CM

## 2015-01-25 DIAGNOSIS — IMO0001 Reserved for inherently not codable concepts without codable children: Secondary | ICD-10-CM

## 2015-01-25 DIAGNOSIS — Z01419 Encounter for gynecological examination (general) (routine) without abnormal findings: Secondary | ICD-10-CM

## 2015-01-25 DIAGNOSIS — Z0000 Encounter for general adult medical examination without abnormal findings: Secondary | ICD-10-CM

## 2015-01-25 DIAGNOSIS — Z01411 Encounter for gynecological examination (general) (routine) with abnormal findings: Secondary | ICD-10-CM | POA: Insufficient documentation

## 2015-01-25 DIAGNOSIS — Z1151 Encounter for screening for human papillomavirus (HPV): Secondary | ICD-10-CM | POA: Insufficient documentation

## 2015-01-25 MED ORDER — NORGESTIM-ETH ESTRAD TRIPHASIC 0.18/0.215/0.25 MG-35 MCG PO TABS: 1.0000 | ORAL_TABLET | Freq: Every day | ORAL | Status: DC

## 2015-01-25 NOTE — Progress Notes (Deleted)
Patient ID: Mick SellCarrie J Simmons, female   DOB: [birth year], 39 y.o.   MRN: [id number] Subjective:     Mick SellCarrie J Simmons is a 39 y.o. woman who comes in today for a  pap smear only. Her most recent annual exam was on ***. Her most recent Pap smear was on *** and showed {pap results:16707::"no abnormalities"}. Previous abnormal Pap smears: {yes***/no:17258}. Contraception: {method:5051}  {Common ambulatory SmartLinks:19316}  Review of Systems {ros; complete:19594}   Objective:    BP 109/62 mmHg  Pulse 60  Temp(Src) 98.8 F (37.1 C) (Oral)  Wt 124 lb (56.246 kg)  LMP 01/11/2015 (Approximate) Pelvic Exam: {pelvic exam:16852::"cervix normal in appearance","external genitalia normal","vagina normal without discharge"}. Pap smear obtained.   Assessment:    Screening pap smear.   Plan:    Follow up in {1-10:13787::"1"} {times; unit:10146::"year"}, or as indicated by Pap results.

## 2015-01-25 NOTE — Patient Instructions (Signed)
Human Papillomavirus HPV stands for human papillomavirus. There are many different types of HPV. People of all ages and races can get an HPV infection. In females, some types of HPV can cause warts on the sex organs or anus. Some females never see any warts on the outside, yet still have the infection inside. The doctor will need to check the sex organs and do a Pap test to see there is an HPV infection. The only sign of infection may be a Pap test that is abnormal. A female who has HPV has a higher chance of getting cancer of the sex organs or anus. It is very rare, but some females can give their babies the HPV infection when the baby is being born. Some of these babies can grow warts on the voice box (vocal cords). Males with HPV have a higher chance of getting cancer of the penis or anus. A female may have HPV but not see any warts on his penis or anus. There is no test to find HPV in males, so even if warts are not seen, there may still be an infection. HOME CARE   Take medicines as told by your doctor.  Get needed Pap tests.  Keep follow-up exams.  Do not touch or scratch the warts.  Do not treat warts with medicines used for treating hand warts.  Tell your sex partner about your infection because he or she may also need treatment.  Do not have sex while you are getting treatment.  After treatment, use condoms during sex.  Use over-the-counter creams for itching as told by your doctor.  Use over-the-counter or prescription medicines for pain, discomfort or fever as told by your doctor.  Do not douche or use tampons during treatment of HPV. GET HELP RIGHT AWAY IF:  You have a temperature by mouth above 102 F (38.9 C), not controlled by medicine. MAKE SURE YOU:   Understand these instructions.  Will watch your condition.  Will get help right away if you are not doing well or get worse. Document Released: 11/08/2008 Document Revised: 02/18/2012 Document Reviewed:  03/03/2014 [facility] Patient Information 2015 [employer], [place]. This information is not intended to replace advice given to you by your health care provider. Make sure you discuss any questions you have with your health care provider.

## 2015-01-25 NOTE — Progress Notes (Signed)
Patient ID: Cheyenne SellCarrie J Simmons, female   DOB: 05/29/1976, 39 y.o.   MRN: [id number] Subjective:     Cheyenne SellCarrie J Simmons is a 39 y.o. female and is here for a comprehensive physical exam. The patient reports here for physical exam and abnormal PAP: +HPV. Last PAP was in Feb 2015 and was abnormal. Need recheck today.  History   Social History  . Marital Status: Single    Spouse Name: N/A  . Number of Children: N/A  . Years of Education: N/A   Occupational History  . Not on file.   Social History Main Topics  . Smoking status: Never Smoker   . Smokeless tobacco: Never Used  . Alcohol Use: Yes     Comment: rare  . Drug Use: No  . Sexual Activity: Not Currently   Other Topics Concern  . Not on file   Social History Narrative   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  Works as as a Media plannerwaitress/cahier.     Health Maintenance  Topic Date Due  . PAP SMEAR  06/27/2014  . INFLUENZA VACCINE  03/11/2015 (Originally 07/10/2014)  . TETANUS/TDAP  07/29/2019    The following portions of the patient's history were reviewed and updated as appropriate: allergies, current medications, past family history, past medical history, past social history, past surgical history and problem list.  Review of Systems Pertinent items are noted in HPI.   Objective:    BP 109/62 mmHg  Pulse 60  Temp(Src) 98.8 F (37.1 C) (Oral)  Wt 124 lb (56.246 kg)  LMP 01/11/2015 (Approximate) General appearance: alert, cooperative and appears stated age Head: Normocephalic, without obvious abnormality, atraumatic Eyes: conjunctivae/corneas clear. PERRL, EOM's intact. Fundi benign. Ears: normal TM's and external ear canals both ears Throat: lips, mucosa, and tongue normal; teeth and gums normal Neck: no adenopathy, no carotid bruit, no JVD, supple, symmetrical, trachea midline and thyroid not enlarged, symmetric, no tenderness/mass/nodules Lungs: clear to auscultation bilaterally Heart: regular rate and rhythm, S1, S2 normal, no murmur,  click, rub or gallop Abdomen: soft, non-tender; bowel sounds normal; no masses,  no organomegaly Pelvic: cervix normal in appearance, external genitalia normal, no adnexal masses or tenderness, no cervical motion tenderness, rectovaginal septum normal, uterus normal size, shape, and consistency and vagina normal without discharge Extremities: extremities normal, atraumatic, no cyanosis or edema Skin: Skin color, texture, turgor normal. No rashes or lesions Neurologic: Alert and oriented X 3, normal strength and tone. Normal symmetric reflexes. Normal coordination and gait    Assessment:    Healthy female exam. PAP done as well      Plan:     See After Visit Summary for Counseling Recommendations

## 2015-01-26 LAB — CYTOLOGY - PAP

## 2015-01-27 ENCOUNTER — Telehealth: Payer: Self-pay | Admitting: Family Medicine

## 2015-01-27 NOTE — Telephone Encounter (Signed)
Message left to call back for test result.  Result: PAP test normal. Repeat in 3-5 yrs.

## 2015-02-21 ENCOUNTER — Ambulatory Visit: Payer: Self-pay

## 2015-02-23 ENCOUNTER — Ambulatory Visit: Payer: Self-pay

## 2015-03-25 ENCOUNTER — Telehealth: Payer: Self-pay | Admitting: Family Medicine

## 2015-06-16 ENCOUNTER — Ambulatory Visit (INDEPENDENT_AMBULATORY_CARE_PROVIDER_SITE_OTHER): Payer: Self-pay | Admitting: Family Medicine

## 2015-06-16 ENCOUNTER — Other Ambulatory Visit (HOSPITAL_COMMUNITY)
Admission: RE | Admit: 2015-06-16 | Discharge: 2015-06-16 | Disposition: A | Discharge status: Home / Self Care | Payer: Self-pay | Source: Ambulatory Visit | Attending: Family Medicine | Admitting: Family Medicine

## 2015-06-16 ENCOUNTER — Encounter: Payer: Self-pay | Admitting: Family Medicine

## 2015-06-16 VITALS — BP 101/59 | HR 60 | Temp 98.4°F | Wt 123.0 lb

## 2015-06-16 DIAGNOSIS — B9689 Other specified bacterial agents as the cause of diseases classified elsewhere: Secondary | ICD-10-CM

## 2015-06-16 DIAGNOSIS — A499 Bacterial infection, unspecified: Secondary | ICD-10-CM

## 2015-06-16 DIAGNOSIS — R399 Unspecified symptoms and signs involving the genitourinary system: Secondary | ICD-10-CM

## 2015-06-16 DIAGNOSIS — N760 Acute vaginitis: Secondary | ICD-10-CM

## 2015-06-16 DIAGNOSIS — Z113 Encounter for screening for infections with a predominantly sexual mode of transmission: Secondary | ICD-10-CM | POA: Insufficient documentation

## 2015-06-16 DIAGNOSIS — N898 Other specified noninflammatory disorders of vagina: Secondary | ICD-10-CM

## 2015-06-16 LAB — POCT WET PREP (WET MOUNT): CLUE CELLS WET PREP WHIFF POC: NEGATIVE

## 2015-06-16 LAB — POCT URINALYSIS DIPSTICK
BILIRUBIN UA: NEGATIVE
GLUCOSE UA: NEGATIVE
KETONES UA: NEGATIVE
LEUKOCYTES UA: NEGATIVE
NITRITE UA: NEGATIVE
Protein, UA: NEGATIVE
RBC UA: NEGATIVE
Spec Grav, UA: 1.01
Urobilinogen, UA: 1
pH, UA: 6

## 2015-06-16 MED ORDER — METRONIDAZOLE 500 MG PO TABS: 500.0000 mg | ORAL_TABLET | Freq: Two times a day (BID) | ORAL | Status: DC

## 2015-06-16 MED ORDER — FLUCONAZOLE 150 MG PO TABS: 150.0000 mg | ORAL_TABLET | Freq: Once | ORAL | Status: DC

## 2015-06-16 NOTE — Patient Instructions (Signed)
Bacterial Vaginosis Bacterial vaginosis is a vaginal infection that occurs when the normal balance of bacteria in the vagina is disrupted. It results from an overgrowth of certain bacteria. This is the most common vaginal infection in women of childbearing age. Treatment is important to prevent complications, especially in pregnant women, as it can cause a premature delivery. CAUSES  Bacterial vaginosis is caused by an increase in harmful bacteria that are normally present in smaller amounts in the vagina. Several different kinds of bacteria can cause bacterial vaginosis. However, the reason that the condition develops is not fully understood. RISK FACTORS Certain activities or behaviors can put you at an increased risk of developing bacterial vaginosis, including:  Having a new sex partner or multiple sex partners.  Douching.  Using an intrauterine device (IUD) for contraception. Women do not get bacterial vaginosis from toilet seats, bedding, swimming pools, or contact with objects around them. SIGNS AND SYMPTOMS  Some women with bacterial vaginosis have no signs or symptoms. Common symptoms include:  Grey vaginal discharge.  A fishlike odor with discharge, especially after sexual intercourse.  Itching or burning of the vagina and vulva.  Burning or pain with urination. DIAGNOSIS  Your health care provider will take a medical history and examine the vagina for signs of bacterial vaginosis. A sample of vaginal fluid may be taken. Your health care provider will look at this sample under a microscope to check for bacteria and abnormal cells. A vaginal pH test may also be done.  TREATMENT  Bacterial vaginosis may be treated with antibiotic medicines. These may be given in the form of a pill or a vaginal cream. A second round of antibiotics may be prescribed if the condition comes back after treatment.  HOME CARE INSTRUCTIONS   Only take over-the-counter or prescription medicines as  directed by your health care provider.  If antibiotic medicine was prescribed, take it as directed. Make sure you finish it even if you start to feel better.  Do not have sex until treatment is completed.  Tell all sexual partners that you have a vaginal infection. They should see their health care provider and be treated if they have problems, such as a mild rash or itching.  Practice safe sex by using condoms and only having one sex partner. SEEK MEDICAL CARE IF:   Your symptoms are not improving after 3 days of treatment.  You have increased discharge or pain.  You have a fever. MAKE SURE YOU:   Understand these instructions.  Will watch your condition.  Will get help right away if you are not doing well or get worse. FOR MORE INFORMATION  Centers for Disease Control and Prevention, Division of STD Prevention: SolutionApps.co.za American Sexual Health Association (ASHA): www.ashastd.org  Document Released: 11/26/2005 Document Revised: 09/16/2013 Document Reviewed: 07/08/2013 [facility] Patient Information 2015 [place], [place]. This information is not intended to replace advice given to you by your health care provider. Make sure you discuss any questions you have with your health care provider.   Candida Infection A Candida infection (also called yeast, fungus, and Monilia infection) is an overgrowth of yeast that can occur anywhere on the body. A yeast infection commonly occurs in warm, moist body areas. Usually, the infection remains localized but can spread to become a systemic infection. A yeast infection may be a sign of a more severe disease such as diabetes, leukemia, or AIDS. A yeast infection can occur in both men and women. In women, Candida vaginitis is a vaginal infection.  It is one of the most common causes of vaginitis. Men usually do not have symptoms or know they have an infection until other problems develop. Men may find out they have a yeast infection because their  sex partner has a yeast infection. Uncircumcised men are more likely to get a yeast infection than circumcised men. This is because the uncircumcised glans is not exposed to air and does not remain as dry as that of a circumcised glans. Older adults may develop yeast infections around dentures. CAUSES  Women  Antibiotics.  Steroid medication taken for a long time.  Being overweight (obese).  Diabetes.  Poor immune condition.  Certain serious medical conditions.  Immune suppressive medications for organ transplant patients.  Chemotherapy.  Pregnancy.  Menstruation.  Stress and fatigue.  Intravenous drug use.  Oral contraceptives.  Wearing tight-fitting clothes in the crotch area.  Catching it from a sex partner who has a yeast infection.  Spermicide.  Intravenous, urinary, or other catheters. Men  Catching it from a sex partner who has a yeast infection.  Having oral or anal sex with a person who has the infection.  Spermicide.  Diabetes.  Antibiotics.  Poor immune system.  Medications that suppress the immune system.  Intravenous drug use.  Intravenous, urinary, or other catheters. SYMPTOMS  Women  Thick, white vaginal discharge.  Vaginal itching.  Redness and swelling in and around the vagina.  Irritation of the lips of the vagina and perineum.  Blisters on the vaginal lips and perineum.  Painful sexual intercourse.  Low blood sugar (hypoglycemia).  Painful urination.  Bladder infections.  Intestinal problems such as constipation, indigestion, bad breath, bloating, increase in gas, diarrhea, or loose stools. Men  Men may develop intestinal problems such as constipation, indigestion, bad breath, bloating, increase in gas, diarrhea, or loose stools.  Dry, cracked skin on the penis with itching or discomfort.  Jock itch.  Dry, flaky skin.  Athlete's foot.  Hypoglycemia. DIAGNOSIS  Women  A history and an exam are  performed.  The discharge may be examined under a microscope.  A culture may be taken of the discharge. Men  A history and an exam are performed.  Any discharge from the penis or areas of cracked skin will be looked at under the microscope and cultured.  Stool samples may be cultured. TREATMENT  Women  Vaginal antifungal suppositories and creams.  Medicated creams to decrease irritation and itching on the outside of the vagina.  Warm compresses to the perineal area to decrease swelling and discomfort.  Oral antifungal medications.  Medicated vaginal suppositories or cream for repeated or recurrent infections.  Wash and dry the irritation areas before applying the cream.  Eating yogurt with Lactobacillus may help with prevention and treatment.  Sometimes painting the vagina with gentian violet solution may help if creams and suppositories do not work. Men  Antifungal creams and oral antifungal medications.  Sometimes treatment must continue for 30 days after the symptoms go away to prevent recurrence. HOME CARE INSTRUCTIONS  Women  Use cotton underwear and avoid tight-fitting clothing.  Avoid colored, scented toilet paper and deodorant tampons or pads.  Do not douche.  Keep your diabetes under control.  Finish all the prescribed medications.  Keep your skin clean and dry.  Consume milk or yogurt with Lactobacillus-active culture regularly. If you get frequent yeast infections and think that is what the infection is, there are over-the-counter medications that you can get. If the infection does not show healing  in 3 days, talk to your caregiver.  Tell your sex partner you have a yeast infection. Your partner may need treatment also, especially if your infection does not clear up or recurs. Men  Keep your skin clean and dry.  Keep your diabetes under control.  Finish all prescribed medications.  Tell your sex partner that you have a yeast infection so he or  she can be treated if necessary. SEEK MEDICAL CARE IF:   Your symptoms do not clear up or worsen in one week after treatment.  You have an oral temperature above 102 F (38.9 C).  You have trouble swallowing or eating for a prolonged time.  You develop blisters on and around your vagina.  You develop vaginal bleeding and it is not your menstrual period.  You develop abdominal pain.  You develop intestinal problems as mentioned above.  You get weak or light-headed.  You have painful or increased urination.  You have pain during sexual intercourse. MAKE SURE YOU:   Understand these instructions.  Will watch your condition.  Will get help right away if you are not doing well or get worse. Document Released: 01/03/2005 Document Revised: 04/12/2014 Document Reviewed: 04/17/2010 [facility] Patient Information 2015 [place], [place]. This information is not intended to replace advice given to you by your health care provider. Make sure you discuss any questions you have with your health care provider.   Thanks for letting us take care of you!   TAke the antibiotics for yeast and for the bacterial vaginosis.   These should improve your symptoms.   You can also take a probiotic to help restore your normal bacteria.   Thanks for letting us take care of you.   Sincerely,  Devota Pace, MD [facility] - PGY 2

## 2015-06-17 LAB — CERVICOVAGINAL ANCILLARY ONLY
Chlamydia: NEGATIVE
NEISSERIA GONORRHEA: NEGATIVE

## 2015-06-20 NOTE — Progress Notes (Signed)
Patient ID: Cheyenne Bolton, female   DOB: November 27, 1976, 39 y.o.   MRN: [id number]   [facility] Yolande Jollyaleb G Maiya Kates, MD Phone: [phone]  Subjective:   Vaginal Discharge.   Having vaginal discharge for 5-7 days. Medications tried: none Discharge consistency: Thin / White Discharge color: White / Clear  Recent antibiotic use: None Sex in last month: None Possible STD exposure: Non per patient. She denies as her husband is incarcerated. Says she has not had sex in > 1 year.   Symptoms Fever: None Dysuria: positive.  Vaginal bleeding: None Abdomen or Pelvic pain: None.  Back pain: None.  Genital sores or ulcers: None per pt.  Rash: None.  Pain during sex: Denies sex.  Missed menstrual period: None.   ROS see HPI Smoking Status noted    All relevant systems were reviewed and were negative unless otherwise noted in the HPI  Past Medical History Reviewed problem list.  Medications- reviewed and updated Current Outpatient Prescriptions  Medication Sig Dispense Refill  . fluconazole (DIFLUCAN) 150 MG tablet Take 1 tablet (150 mg total) by mouth once. May repeat dose in 3 days if symptoms persist. 2 tablet 0  . metroNIDAZOLE (FLAGYL) 500 MG tablet Take 1 tablet (500 mg total) by mouth 2 (two) times daily. 14 tablet 0  . Norgestimate-Ethinyl Estradiol Triphasic 0.18/0.215/0.25 MG-35 MCG tablet Take 1 tablet by mouth daily. 1 Package 11   No current facility-administered medications for this visit.   Chief complaint-noted No additions to family history Social history- patient is a non smoker  Objective: BP 101/59 mmHg  Pulse 60  Temp(Src) 98.4 F (36.9 C) (Oral)  Wt 123 lb (55.792 kg)  LMP 05/30/2015 (Approximate) Gen: NAD, alert, cooperative with exam HEENT: NCAT, EOMI, PERRL Neck: FROM, supple CV: RRR, good S1/S2, no murmur Resp: CTABL, no wheezes, non-labored Abd: SNTND, BS present, no guarding or organomegaly G/U: Speculum exam performed  with chaperone, whitish thin vaginal discharge noted, no foul odor. No evidence of vaginal or external genitalia lesions. No cervical lesions. Bimanual exam with some mild discomfort, but otherwise no lateralizing pain.  Ext: No edema, warm, normal tone, moves UE/LE spontaneously Neuro: Alert and oriented, No gross deficits Skin: no rashes no lesions  Assessment/Plan:  # Pt. With Bacterial Vaginosis / Yeast  - no evidence of PID at this time.  - Treated with Diflucan x 1 dose and given one more pill to repeat in 3 days if symptoms continue to persist.  - Treated with Flagyl x 7 days.  - Return if symptoms worsen or persist.  - Avoid douching or cleaning at this time.  - Probiotic / yogurt recommended.

## 2015-08-22 ENCOUNTER — Other Ambulatory Visit: Payer: Self-pay | Admitting: Family Medicine

## 2015-08-22 ENCOUNTER — Telehealth: Payer: Self-pay | Admitting: Family Medicine

## 2015-08-22 DIAGNOSIS — Z0120 Encounter for dental examination and cleaning without abnormal findings: Secondary | ICD-10-CM

## 2015-08-22 NOTE — Telephone Encounter (Signed)
Refill request from pt. Will forward to PCP for review. Carren Blakley, CMA. 

## 2015-08-22 NOTE — Telephone Encounter (Signed)
Please advise patient. 1. She has refill at the [facility] of her birth control till next year. Advise her to call pharmacy to check on her refill first. 2. Dental referral placed.

## 2015-08-22 NOTE — Telephone Encounter (Signed)
Patient is requesting a refill on her birth control, she is asking for previsen to be sent to the [facility] pharmacy, says this is one of the two they cover with the orange card. Pt also requesting a dental referral, has a cavity on her bottom tooth.

## 2015-08-23 ENCOUNTER — Other Ambulatory Visit: Payer: Self-pay | Admitting: Family Medicine

## 2015-08-23 NOTE — Telephone Encounter (Signed)
LMTCB will try again later. If pt calls back please advise pt she needs to schedule appt with Dr. Lum Babe about changing BCP. Lopez Dentinger, CMA.

## 2015-08-23 NOTE — Telephone Encounter (Signed)
I am not sure what brand is that, I could not find it on Epic. Please have her come see me soon for birth control management.

## 2015-08-23 NOTE — Telephone Encounter (Signed)
Pt called back because she doesn't need refills on her BC she wants to change this to the brand Previsen. jw

## 2015-08-23 NOTE — Telephone Encounter (Signed)
LMTCB, will try again later. Will advise pt as below but also need to let pt know FA will be ending soon on 09/01/15 and that she needs to call [facility] for renewal as it still maybe a few months before pt will be seen at dentist office. Kento Gossman, CMA.

## 2015-08-23 NOTE — Telephone Encounter (Signed)
Will forward to PCP for review of changing BCP. Jackeline Gutknecht, CMA.

## 2015-08-24 NOTE — Telephone Encounter (Signed)
Spoke with patient and she declined an appt because it cost her $70 every office visit.  Per her FYI tab patient is covered at 100% until 09-01-15.  She states that the health department only covers 2 types of medication and she would like to switch to the ortho cyclen lo, generic name.  The generic name is Previfem oral .  Patient states that this will would keep her hormones more balanced.  Will forward to MD to see if this can be changed for patient since she is currently on ortho tri cyclen lo.  Was unable to complete phone call with patient due to the call being lost.  [facility]

## 2015-08-25 ENCOUNTER — Telehealth: Payer: Self-pay | Admitting: Family Medicine

## 2015-08-25 MED ORDER — NORGESTIMATE-ETH ESTRADIOL 0.25-35 MG-MCG PO TABS: 1.0000 | ORAL_TABLET | Freq: Every day | ORAL | Status: DC

## 2015-08-25 NOTE — Telephone Encounter (Signed)
Ordered as fax to pharm. pls inform patient.

## 2015-08-25 NOTE — Telephone Encounter (Signed)
Lm for patient that her new script was sent to pharmacy. Jazmin Hartsell,CMA

## 2015-08-25 NOTE — Telephone Encounter (Signed)
Birth control ordered as fax to pharmacy. Please inform patient.

## 2015-09-06 ENCOUNTER — Ambulatory Visit: Payer: Self-pay

## 2015-09-12 ENCOUNTER — Ambulatory Visit: Payer: Self-pay

## 2017-04-04 ENCOUNTER — Other Ambulatory Visit (HOSPITAL_COMMUNITY): Payer: Self-pay | Admitting: Nurse Practitioner

## 2017-04-04 DIAGNOSIS — IMO0002 Reserved for concepts with insufficient information to code with codable children: Secondary | ICD-10-CM

## 2017-04-04 DIAGNOSIS — R229 Localized swelling, mass and lump, unspecified: Principal | ICD-10-CM

## 2017-04-17 ENCOUNTER — Ambulatory Visit (HOSPITAL_COMMUNITY)
Admission: RE | Admit: 2017-04-17 | Discharge: 2017-04-17 | Disposition: A | Discharge status: Home / Self Care | Payer: PRIVATE HEALTH INSURANCE | Source: Ambulatory Visit | Attending: Nurse Practitioner | Admitting: Nurse Practitioner

## 2017-04-17 DIAGNOSIS — N6310 Unspecified lump in the right breast, unspecified quadrant: Secondary | ICD-10-CM | POA: Insufficient documentation

## 2017-04-17 DIAGNOSIS — IMO0002 Reserved for concepts with insufficient information to code with codable children: Secondary | ICD-10-CM

## 2017-04-17 DIAGNOSIS — R229 Localized swelling, mass and lump, unspecified: Principal | ICD-10-CM

## 2017-04-17 DIAGNOSIS — N644 Mastodynia: Secondary | ICD-10-CM | POA: Insufficient documentation

## 2017-04-30 ENCOUNTER — Encounter (HOSPITAL_COMMUNITY): Payer: Self-pay

## 2018-04-23 ENCOUNTER — Other Ambulatory Visit: Payer: Self-pay

## 2018-04-23 ENCOUNTER — Ambulatory Visit (INDEPENDENT_AMBULATORY_CARE_PROVIDER_SITE_OTHER): Payer: BLUE CROSS/BLUE SHIELD | Admitting: Family Medicine

## 2018-04-23 ENCOUNTER — Encounter: Payer: Self-pay | Admitting: Family Medicine

## 2018-04-23 VITALS — BP 110/64 | HR 76 | Temp 98.1°F | Resp 14 | Ht 62.0 in | Wt 133.0 lb

## 2018-04-23 DIAGNOSIS — B079 Viral wart, unspecified: Secondary | ICD-10-CM

## 2018-04-23 DIAGNOSIS — Z0000 Encounter for general adult medical examination without abnormal findings: Secondary | ICD-10-CM | POA: Diagnosis not present

## 2018-04-23 DIAGNOSIS — S8991XA Unspecified injury of right lower leg, initial encounter: Secondary | ICD-10-CM

## 2018-04-23 LAB — CBC WITH DIFFERENTIAL/PLATELET
Basophils Absolute: 17 cells/uL (ref 0–200)
Basophils Relative: 0.2 %
EOS ABS: 116 {cells}/uL (ref 15–500)
Eosinophils Relative: 1.4 %
HEMATOCRIT: 37.3 % (ref 35.0–45.0)
Hemoglobin: 13 g/dL (ref 11.7–15.5)
LYMPHS ABS: 2175 {cells}/uL (ref 850–3900)
MCH: 32.4 pg (ref 27.0–33.0)
MCHC: 34.9 g/dL (ref 32.0–36.0)
MCV: 93 fL (ref 80.0–100.0)
MPV: 11.5 fL (ref 7.5–12.5)
Monocytes Relative: 6 %
NEUTROS PCT: 66.2 %
Neutro Abs: 5495 cells/uL (ref 1500–7800)
Platelets: 214 10*3/uL (ref 140–400)
RBC: 4.01 10*6/uL (ref 3.80–5.10)
RDW: 12.1 % (ref 11.0–15.0)
Total Lymphocyte: 26.2 %
WBC: 8.3 10*3/uL (ref 3.8–10.8)
WBCMIX: 498 {cells}/uL (ref 200–950)

## 2018-04-23 LAB — LIPID PANEL
Cholesterol: 105 mg/dL (ref <200)
HDL: 56 mg/dL (ref >50)
LDL CHOLESTEROL (CALC): 37 mg/dL
NON-HDL CHOLESTEROL (CALC): 49 mg/dL (ref <130)
Total CHOL/HDL Ratio: 1.9 (calc) (ref <5.0)
Triglycerides: 43 mg/dL (ref <150)

## 2018-04-23 LAB — COMPREHENSIVE METABOLIC PANEL
AG RATIO: 1.8 (calc) (ref 1.0–2.5)
ALT: 10 U/L (ref 6–29)
AST: 16 U/L (ref 10–30)
Albumin: 4.1 g/dL (ref 3.6–5.1)
Alkaline phosphatase (APISO): 69 U/L (ref 33–115)
BUN: 10 mg/dL (ref 7–25)
CHLORIDE: 103 mmol/L (ref 98–110)
CO2: 26 mmol/L (ref 20–32)
CREATININE: 0.73 mg/dL (ref 0.50–1.10)
Calcium: 9.1 mg/dL (ref 8.6–10.2)
GLOBULIN: 2.3 g/dL (ref 1.9–3.7)
GLUCOSE: 85 mg/dL (ref 65–99)
POTASSIUM: 3.7 mmol/L (ref 3.5–5.3)
Sodium: 137 mmol/L (ref 135–146)
Total Bilirubin: 0.8 mg/dL (ref 0.2–1.2)
Total Protein: 6.4 g/dL (ref 6.1–8.1)

## 2018-04-23 MED ORDER — CETIRIZINE HCL 10 MG PO TABS: 10.0000 mg | ORAL_TABLET | Freq: Every day | ORAL | 11 refills | Status: DC | PRN

## 2018-04-23 NOTE — Progress Notes (Signed)
   Subjective:    Patient ID: Cheyenne Bolton, female    DOB: Dec 14, 1975, 42 y.o.   MRN: [id number]  Patient presents for New Patient CPE (is not fasting)  Pt here to establish care, previous PCP - [facility] in [place] last seen in 2016   Family and personal history/meds reviewed   [facility] Exam last in 2018- August 2017 was last PAP Smear  [facility] - has upcoming appointment  Had recent miscarriage   Mammogram- May 2018 Immunizations- UTD  Non smoker   Seasonal allergies- on zyrtec, and Prenatal vitamins    Wart on left hand ring finger- has been there a few months, used a cream daughter had just  Dried out the top   In Sept- was playing soccer with grandson, still has pain Right lower inner leg  in same spot of hematoma, when has occasionally burning sensation in the area with walking, but if you touch the area, or put pressure directly on it has pain     Review Of Systems:  GEN- denies fatigue, fever, weight loss,weakness, recent illness HEENT- denies eye drainage, change in vision, nasal discharge, CVS- denies chest pain, palpitations RESP- denies SOB, cough, wheeze ABD- denies N/V, change in stools, abd pain GU- denies dysuria, hematuria, dribbling, incontinence MSK- denies joint pain, muscle aches, injury Neuro- denies headache, dizziness, syncope, seizure activity       Objective:    BP 110/64   Pulse 76   Temp 98.1 F (36.7 C) (Oral)   Resp 14   Ht  (1.575 m)   Wt 133 lb (60.3 kg)   SpO2 98%   BMI 24.33 kg/m  GEN- NAD, alert and oriented x3 HEENT- PERRL, EOMI, non injected sclera, pink conjunctiva, MMM, oropharynx clear Neck- Supple, no thyromegaly CVS- RRR, no murmur RESP-CTAB ABD-NABS,soft,NT,ND EXT- No edema, TTP lower medial right leg, small nodule palpated with point tenderness, no gross swelling, no erythema,  MSK- FROM ankle Leg, lower ext  Skin- wart left ring finger just below MIP Pulses- Radial, DP-  2+        Assessment & Plan:      Problem List Items Addressed This Visit    None    Visit Diagnoses    Routine general medical examination at a health care facility    -  Primary   CPE done, obtain fasting labs. Immunizations UTD, F/U GYN estbalishing visit   Relevant Orders   Comprehensive metabolic panel   CBC with Differential/Platelet   Lipid panel   Viral wart on finger       Cryotherapy- verbal consent obtained , Cryotherapy via liquid nitrogen applied 5-10 seconds x 3 passes, bandaid, repeat in 3 weeks if needed , pt tolerated procedure well    Injury of right lower extremity, initial encounter       still has point pain on right lower leg, small nodule, query if she had avulsion fracture or hairline fracture, pain 8 months out obtain xray      Note: This dictation was prepared with Dragon dictation along with smaller phrase technology. Any transcriptional errors that result from this process are unintentional.

## 2018-04-23 NOTE — Patient Instructions (Signed)
Get xray of leg  [facility]- [address] [place] [address] We can freeze the wart again in 3 weeks if needed  F/U pending results of labs

## 2018-04-24 ENCOUNTER — Other Ambulatory Visit: Payer: Self-pay | Admitting: *Deleted

## 2018-04-24 ENCOUNTER — Ambulatory Visit
Admission: RE | Admit: 2018-04-24 | Discharge: 2018-04-24 | Disposition: A | Discharge status: Home / Self Care | Payer: BLUE CROSS/BLUE SHIELD | Source: Ambulatory Visit | Attending: Family Medicine | Admitting: Family Medicine

## 2018-04-24 DIAGNOSIS — S8991XA Unspecified injury of right lower leg, initial encounter: Secondary | ICD-10-CM

## 2018-05-13 IMAGING — MG 2D DIGITAL DIAGNOSTIC BILATERAL MAMMOGRAM WITH CAD AND ADJUNCT T
6 of 12 series · 6 of 36 positions shown · non-contrast
Comparison: Previous exam(s).

CLINICAL DATA: 40-year-old female states that the ordering
physician identified a palpable lump in the right breast. Patient
also describes diffuse right breast pain and diffuse left breast
pain, most prominent at the left nipple.

EXAM:
2D DIGITAL DIAGNOSTIC BILATERAL MAMMOGRAM WITH CAD AND ADJUNCT TOMO
ULTRASOUND BILATERAL BREAST

[R CC]
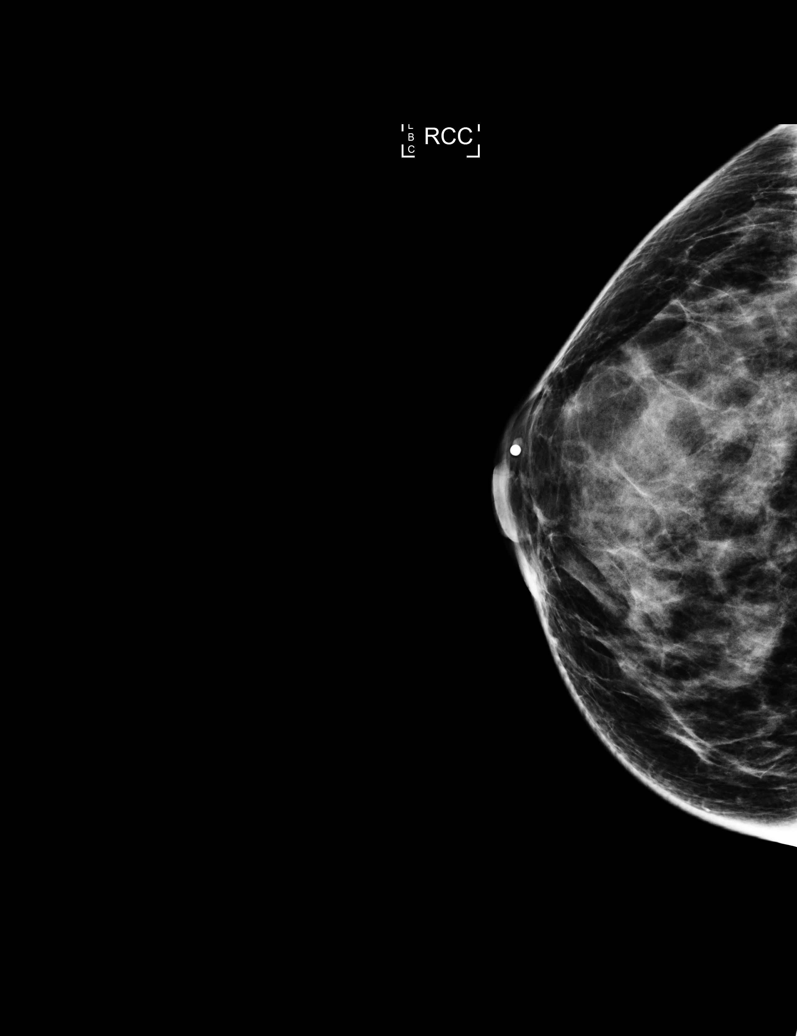

[R TAN]
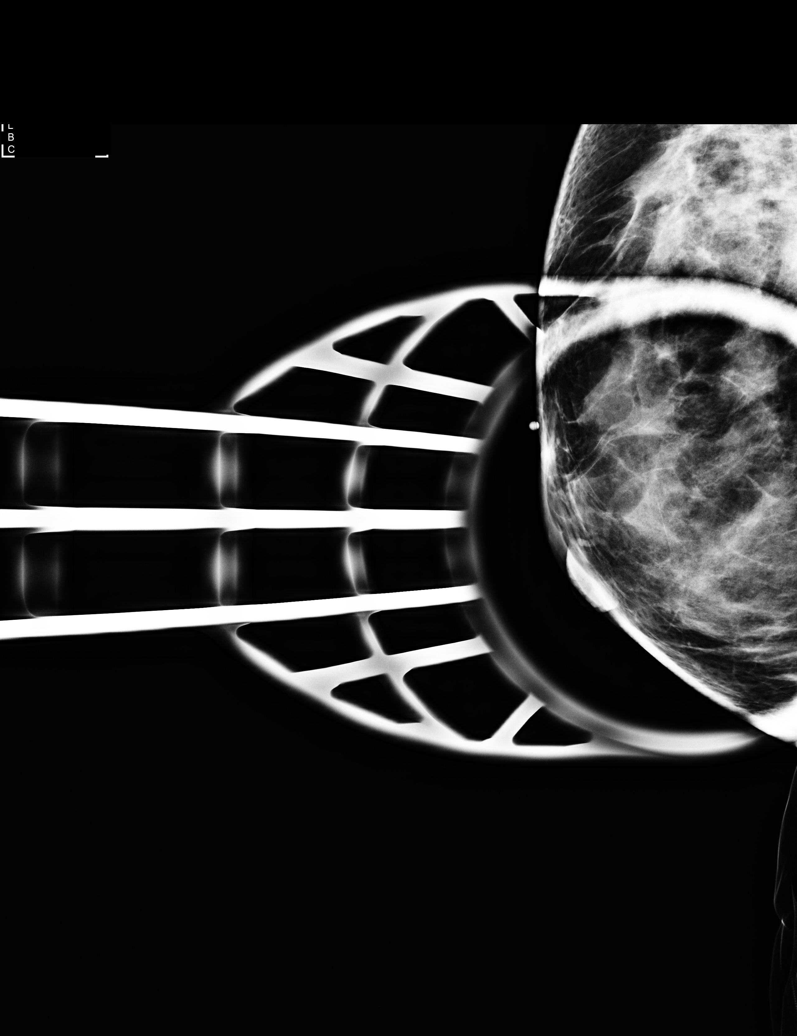

[L TAN]
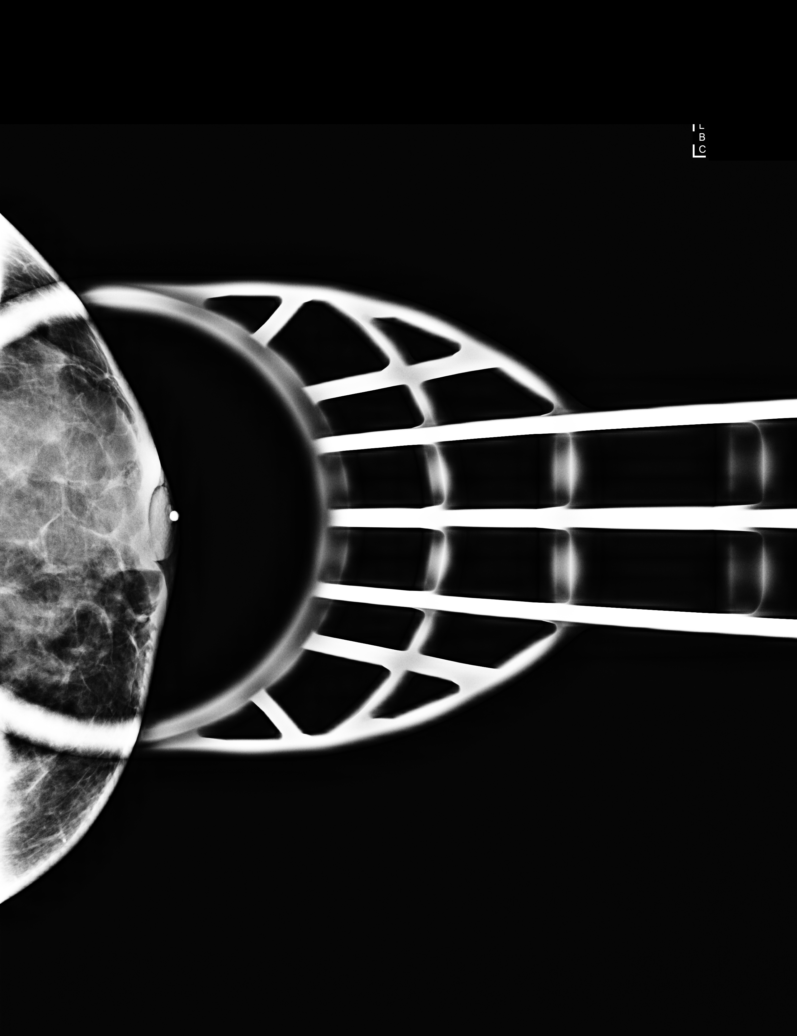

[L CC]
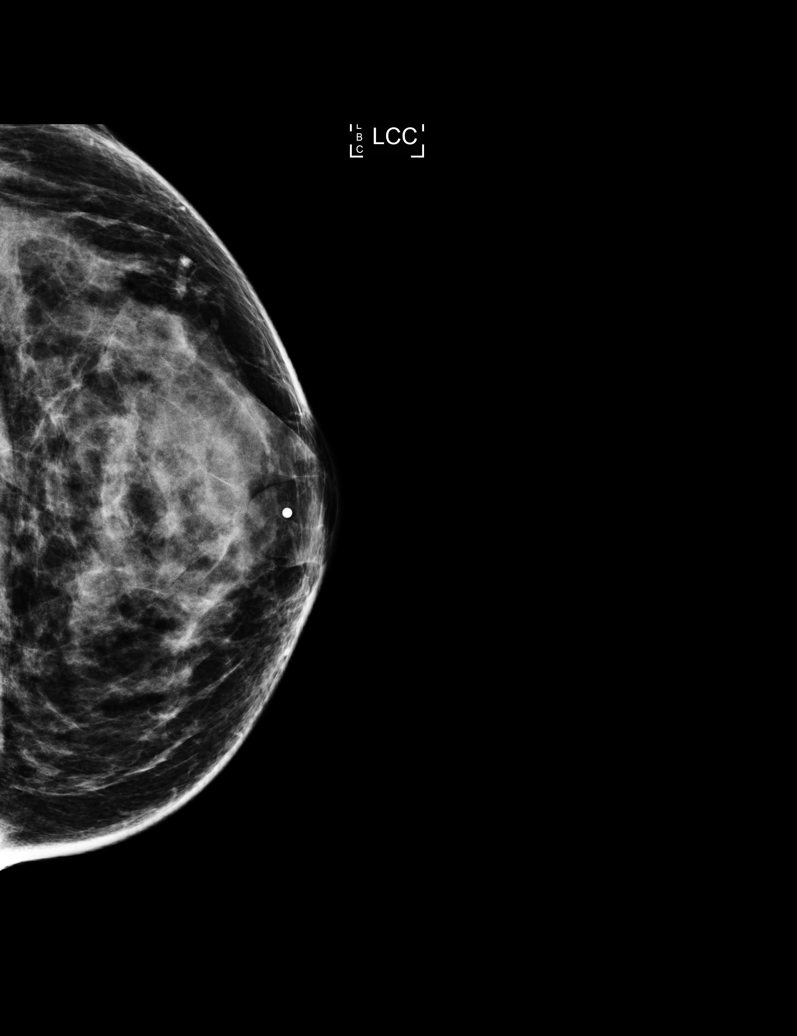

[L MLO]
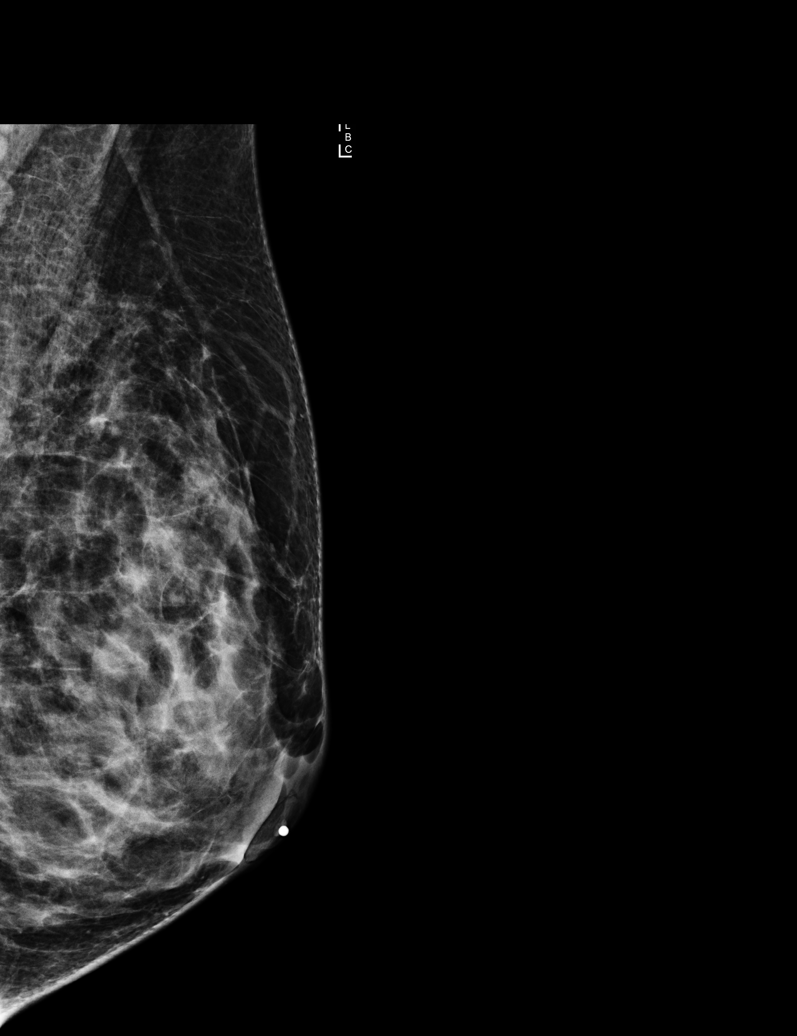

[R MLO]
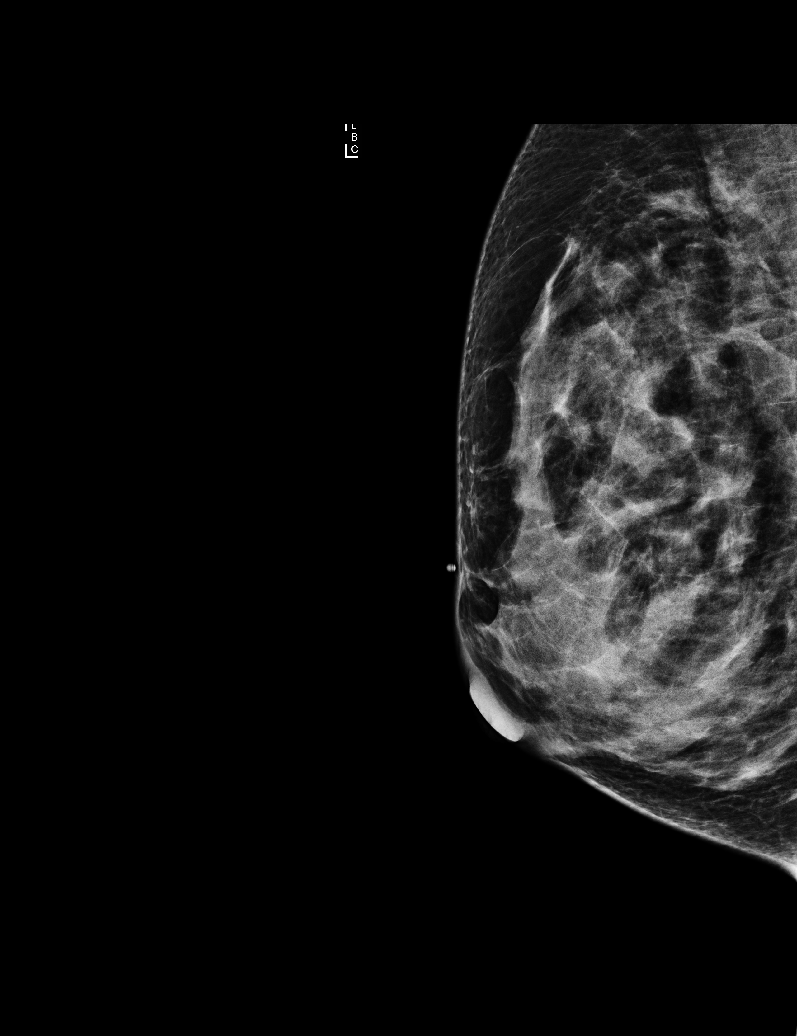

[6 of 36 positions shown; findings below may reference images not displayed]

ACR Breast Density Category c: The breast tissue is heterogeneously
dense, which may obscure small masses.
FINDINGS: Bilateral 2D CC and MLO projections were obtained today, with
additional 3D tomosynthesis, and with additional spot compression
views of each breast corresponding to the areas of clinical concern,
with overlying skin markers in place.

There are no dominant masses, suspicious calcifications or secondary
signs of malignancy within either breast. Specifically, there is no
mammographic abnormality within either breast corresponding to the
areas of clinical concern.

Mammographic images were processed with CAD.

On physical exam, there is vague soft tissue thickening within the
upper and outer right breast without evidence of circumscribed mass.
No palpable lump is identified within the subareolar or periareolar
left breast.

Targeted ultrasound is performed, evaluating the upper-outer
quadrant of the right breast corresponding to the area of clinical
concern as directed by the patient, showing only normal
fibroglandular tissues and fat lobules. There is a ridge of normal
dense fibroglandular tissue within the right breast at the 10
o'clock axis corresponding to the area of clinical concern. No
suspicious solid or cystic mass is identified.

Next, targeted ultrasound is performed, evaluating the subareolar
and periareolar left breast, showing only normal fibroglandular
tissues and fat lobules throughout. No suspicious solid or cystic
mass is identified.
IMPRESSION: No evidence of malignancy within either breast.

RECOMMENDATION:
Screening mammogram in one year.(Code:[id number])

Benign causes of breast pain, and possible remedies, were discussed
with the patient. Patient was encouraged to follow-up with referring
physician if pain became localized and persistent or if a palpable
lump/mass developed.

I have discussed the findings and recommendations with the patient.
Results were also provided in writing at the conclusion of the
visit. If applicable, a reminder letter will be sent to the patient
regarding the next appointment.

BI-RADS CATEGORY  1: Negative.

## 2018-10-06 ENCOUNTER — Ambulatory Visit (INDEPENDENT_AMBULATORY_CARE_PROVIDER_SITE_OTHER): Payer: BLUE CROSS/BLUE SHIELD

## 2018-10-06 DIAGNOSIS — Z23 Encounter for immunization: Secondary | ICD-10-CM | POA: Diagnosis not present

## 2018-10-06 NOTE — Progress Notes (Signed)
Patient was in office for flu vaccine .Patient received vaccine in her left deltoid. patient tolerated well  

## 2018-11-11 ENCOUNTER — Encounter: Payer: Self-pay | Admitting: Family Medicine

## 2018-11-11 ENCOUNTER — Other Ambulatory Visit: Payer: BLUE CROSS/BLUE SHIELD

## 2018-11-11 DIAGNOSIS — Z112 Encounter for screening for other bacterial diseases: Secondary | ICD-10-CM

## 2018-11-11 DIAGNOSIS — Z20818 Contact with and (suspected) exposure to other bacterial communicable diseases: Secondary | ICD-10-CM

## 2018-11-14 ENCOUNTER — Encounter: Payer: Self-pay | Admitting: Family Medicine

## 2018-11-14 LAB — BORDETELLA PERTUSSIS PCR
B. PARAPERTUSSIS DNA: NOT DETECTED
B. PERTUSSIS DNA: NOT DETECTED

## 2019-01-02 ENCOUNTER — Telehealth: Payer: Self-pay | Admitting: Family Medicine

## 2019-01-02 MED ORDER — OSELTAMIVIR PHOSPHATE 75 MG PO CAPS: 75.0000 mg | ORAL_CAPSULE | Freq: Every day | ORAL | 0 refills | Status: DC

## 2019-01-02 NOTE — Telephone Encounter (Signed)
tamiflu [facility]

## 2019-01-02 NOTE — Telephone Encounter (Signed)
Ok to order 

## 2019-12-15 ENCOUNTER — Ambulatory Visit (INDEPENDENT_AMBULATORY_CARE_PROVIDER_SITE_OTHER): Payer: 59 | Admitting: Family Medicine

## 2019-12-15 ENCOUNTER — Ambulatory Visit
Admission: RE | Admit: 2019-12-15 | Discharge: 2019-12-15 | Disposition: A | Discharge status: Home / Self Care | Payer: BLUE CROSS/BLUE SHIELD | Source: Ambulatory Visit | Attending: Family Medicine | Admitting: Family Medicine

## 2019-12-15 ENCOUNTER — Other Ambulatory Visit: Payer: Self-pay

## 2019-12-15 ENCOUNTER — Encounter: Payer: Self-pay | Admitting: Family Medicine

## 2019-12-15 VITALS — BP 110/64 | HR 60 | Temp 98.9°F | Resp 14 | Ht 62.0 in | Wt 130.0 lb

## 2019-12-15 DIAGNOSIS — Z124 Encounter for screening for malignant neoplasm of cervix: Secondary | ICD-10-CM | POA: Diagnosis not present

## 2019-12-15 DIAGNOSIS — R102 Pelvic and perineal pain: Secondary | ICD-10-CM

## 2019-12-15 DIAGNOSIS — Z1239 Encounter for other screening for malignant neoplasm of breast: Secondary | ICD-10-CM | POA: Diagnosis not present

## 2019-12-15 DIAGNOSIS — R3129 Other microscopic hematuria: Secondary | ICD-10-CM

## 2019-12-15 DIAGNOSIS — Z3200 Encounter for pregnancy test, result unknown: Secondary | ICD-10-CM | POA: Diagnosis not present

## 2019-12-15 DIAGNOSIS — R1030 Lower abdominal pain, unspecified: Secondary | ICD-10-CM | POA: Diagnosis not present

## 2019-12-15 LAB — URINALYSIS, ROUTINE W REFLEX MICROSCOPIC
Bilirubin Urine: NEGATIVE
Glucose, UA: NEGATIVE
Hyaline Cast: NONE SEEN /LPF
Ketones, ur: NEGATIVE
Leukocytes,Ua: NEGATIVE
Nitrite: NEGATIVE
Protein, ur: NEGATIVE
RBC / HPF: NONE SEEN /HPF (ref 0–2)
Specific Gravity, Urine: 1.02 (ref 1.001–1.03)
pH: 6.5 (ref 5.0–8.0)

## 2019-12-15 LAB — WET PREP FOR TRICH, YEAST, CLUE

## 2019-12-15 LAB — PREGNANCY, URINE: Preg Test, Ur: NEGATIVE

## 2019-12-15 LAB — MICROSCOPIC MESSAGE

## 2019-12-15 NOTE — Patient Instructions (Addendum)
Get the xray done at  [address] [facility] [address] We will call with results Schedule your mammogram- [facility] [address]  F/U pending results

## 2019-12-15 NOTE — Progress Notes (Signed)
   Subjective:    Patient ID: Cheyenne Bolton, female    DOB: [birth year], 44 y.o.   MRN: [id number]  Patient presents for Lower Abd Pain (no changes to BM, pressure and discomfort)   Pt here with lower abdominal pain/pelvic pressure that radiates to her back. She gets pressure and urinary frequency  That comes and goes. She was last seen at [facility] in Nov had vaginal cultures done as well as urine culture she was given antibiotics but she states it only helped temporarily. This has been intermittent for the past 3 months or so Menses are regular but last one only lasted 1.5 days in December.  She is sexually active. She does have some soreness in breast, no current contraception Occ gets sharp pain into ovary region that goes down to her legs  No vaginal discharge  Due for PAP Smear, last in 2016, due for mammogram Denies any constipation    Menses typically regular and last 4 days   Review Of Systems:  GEN- denies fatigue, fever, weight loss,weakness, recent illness HEENT- denies eye drainage, change in vision, nasal discharge, CVS- denies chest pain, palpitations RESP- denies SOB, cough, wheeze ABD- denies N/V, change in stools, +abd pain GU- denies dysuria, hematuria, dribbling, incontinence MSK- denies joint pain, muscle aches, injury Neuro- denies headache, dizziness, syncope, seizure activity       Objective:    BP 110/64   Pulse 60   Temp 98.9 F (37.2 C) (Temporal)   Resp 14   Ht 5\' 2"  (1.575 m)   Wt 130 lb (59 kg)   LMP 12/02/2019 Comment: regular  SpO2 99%   BMI 23.78 kg/m  GEN- NAD, alert and oriented x3 CVS- RRR, no murmur RESP-CTAB ABD-NABS,soft,TTP suprapubic region ,ND GU- normal external genitalia, vaginal mucosa pink and moist, cervix visualized no growth, + blood form os, + clear discharge, no CMT, no ovarian masses, uterus normal size EXT- No edema Pulses- Radial, DP- 2+        Assessment & Plan:      Problem List Items Addressed This Visit    None    Visit Diagnoses    Lower abdominal pain    -  Primary   UA does not show any infection, treace blood, obtain KUB, r/o stone, constipation   Relevant Orders   Urinalysis, Routine w reflex microscopic (Completed)   Urine Culture   WET PREP FOR TRICH, YEAST, CLUE (Completed)   C. trachomatis/N. gonorrhoeae RNA   DG Abd 2 Views   Encounter for confirmation of pregnancy test result with physical examination       U preg negative, cultures sent due to mild dicharge and spotting, though PAP Brush can also produce some spotting   Relevant Orders   Pregnancy, urine (Completed)   Cervical cancer screening       Relevant Orders   Pap IG w/ reflex to HPV when ASC-U 12/04/2019)   Encounter for breast cancer screening using non-mammogram modality       Relevant Orders   MM SCREENING BREAST TOMO BILATERAL   Microscopic hematuria       Relevant Orders   DG Abd 2 Views   Pelvic pain       If above work up negative, would send to GYN for ultrasound and further evaluation      Note: This dictation was prepared with [employer] dictation along with smaller Nurse, children's. Any transcriptional errors that result from this process are unintentional.

## 2019-12-16 ENCOUNTER — Other Ambulatory Visit: Payer: Self-pay | Admitting: *Deleted

## 2019-12-16 LAB — PAP IG W/ RFLX HPV ASCU

## 2019-12-16 LAB — URINE CULTURE
MICRO NUMBER:: 10008484
SPECIMEN QUALITY:: ADEQUATE

## 2019-12-16 LAB — C. TRACHOMATIS/N. GONORRHOEAE RNA
C. trachomatis RNA, TMA: NOT DETECTED
N. gonorrhoeae RNA, TMA: NOT DETECTED

## 2019-12-16 MED ORDER — POLYETHYLENE GLYCOL 3350 17 GM/SCOOP PO POWD: 17.0000 g | Freq: Every day | ORAL | 1 refills | Status: DC

## 2020-01-29 ENCOUNTER — Other Ambulatory Visit: Payer: Self-pay

## 2020-01-29 ENCOUNTER — Ambulatory Visit
Admission: RE | Admit: 2020-01-29 | Discharge: 2020-01-29 | Disposition: A | Discharge status: Home / Self Care | Payer: 59 | Source: Ambulatory Visit | Attending: Family Medicine | Admitting: Family Medicine

## 2020-01-29 DIAGNOSIS — Z1239 Encounter for other screening for malignant neoplasm of breast: Secondary | ICD-10-CM

## 2020-02-08 ENCOUNTER — Ambulatory Visit: Payer: 59

## 2020-02-23 ENCOUNTER — Other Ambulatory Visit: Payer: Self-pay

## 2020-02-23 ENCOUNTER — Ambulatory Visit: Payer: 59 | Admitting: Family Medicine

## 2020-02-23 ENCOUNTER — Encounter: Payer: Self-pay | Admitting: Family Medicine

## 2020-02-23 VITALS — BP 112/60 | HR 66 | Temp 99.1°F | Resp 14 | Ht 62.0 in | Wt 130.0 lb

## 2020-02-23 DIAGNOSIS — N920 Excessive and frequent menstruation with regular cycle: Secondary | ICD-10-CM | POA: Diagnosis not present

## 2020-02-23 DIAGNOSIS — R5383 Other fatigue: Secondary | ICD-10-CM

## 2020-02-23 NOTE — Patient Instructions (Signed)
Referral to GYN We will call with lab results F/U as needed

## 2020-02-23 NOTE — Progress Notes (Signed)
   Subjective:    Patient ID: Cheyenne Bolton, female    DOB: 31-Oct-1976, 44 y.o.   MRN: [id number]  Patient presents for Fatigue (x2 weeks- severe fatigue, states that she is really cold all the time, severe bleeding with intercourse)   Pt here with fatigue, worse over the past 2 weeks. She was working full time, going to hospital to stay with mother and sleep was also decreased only getting 5-6 hour s  LMP was 3/5, lasted 3 days, but had 1 day of heavy bleeding, soaking through Tampon, the next day was spotting and then it went away which is usual   She tried changing diet, eating more healthy foods, instead of food from work at [employer], she went to bed earlier and still feels exhausted   She felt more cold than normal as well   No fever, no URI symptoms   Menses have been regular but shorter than typical  Last sexual activity yesterday. Before then it has been 3 months, since husband has been ou tof town  She had severe bleeding last night after intercourse- bright red blood and cramps    Review Of Systems:  GEN- denies fatigue, fever, weight loss,weakness, recent illness HEENT- denies eye drainage, change in vision, nasal discharge, CVS- denies chest pain, palpitations RESP- denies SOB, cough, wheeze ABD- denies N/V, change in stools, abd pain GU- denies dysuria, hematuria, dribbling, incontinence MSK- denies joint pain, muscle aches, injury Neuro- denies headache, dizziness, syncope, seizure activity       Objective:    BP 112/60   Pulse 66   Temp 99.1 F (37.3 C) (Temporal)   Resp 14   Ht 5\' 2"  (1.575 m)   Wt 130 lb (59 kg)   LMP 02/13/2020   SpO2 97%   BMI 23.78 kg/m  GEN- NAD, alert and oriented x3 HEENT- PERRL, EOMI, non injected sclera, pink conjunctiva, Neck- Supple, no thyromegaly CVS- RRR, no murmur RESP-CTAB ABD-NABS,soft,NT,ND EXT- No edema Pulses- Radial  2+        Assessment & Plan:      Problem List Items Addressed This Visit    None    Visit Diagnoses    Other fatigue    -  Primary   Check labs for anemia, TSH. May be MTF with recent stressors and poor sleep   Relevant Orders   CBC with Differential/Platelet   Comprehensive metabolic panel   TSH   Iron, TIBC and Ferritin Panel   Ambulatory referral to Gynecology   Menorrhagia with regular cycle       Very heavy bleeding episodes, even with intercourse, dicussed with pt, would like GYN evaluation, possible laceration to cervix, last night or polyp bleeding, did not have any symptoms of STI prior to intercourse     Relevant Orders   TSH   FSH/LH   Iron, TIBC and Ferritin Panel   Ambulatory referral to Gynecology      Note: This dictation was prepared with Dragon dictation along with smaller phrase technology. Any transcriptional errors that result from this process are unintentional.

## 2020-02-24 LAB — COMPREHENSIVE METABOLIC PANEL
AG Ratio: 1.8 (calc) (ref 1.0–2.5)
ALT: 10 U/L (ref 6–29)
AST: 15 U/L (ref 10–30)
Albumin: 4 g/dL (ref 3.6–5.1)
Alkaline phosphatase (APISO): 72 U/L (ref 31–125)
BUN: 10 mg/dL (ref 7–25)
CO2: 27 mmol/L (ref 20–32)
Calcium: 9.2 mg/dL (ref 8.6–10.2)
Chloride: 105 mmol/L (ref 98–110)
Creat: 0.66 mg/dL (ref 0.50–1.10)
Globulin: 2.2 g/dL (calc) (ref 1.9–3.7)
Glucose, Bld: 84 mg/dL (ref 65–99)
Potassium: 4.5 mmol/L (ref 3.5–5.3)
Sodium: 139 mmol/L (ref 135–146)
Total Bilirubin: 1 mg/dL (ref 0.2–1.2)
Total Protein: 6.2 g/dL (ref 6.1–8.1)

## 2020-02-24 LAB — CBC WITH DIFFERENTIAL/PLATELET
Absolute Monocytes: 369 cells/uL (ref 200–950)
Basophils Absolute: 31 cells/uL (ref 0–200)
Basophils Relative: 0.6 %
Eosinophils Absolute: 120 cells/uL (ref 15–500)
Eosinophils Relative: 2.3 %
HCT: 42 % (ref 35.0–45.0)
Hemoglobin: 14.2 g/dL (ref 11.7–15.5)
Lymphs Abs: 1414 cells/uL (ref 850–3900)
MCH: 32.6 pg (ref 27.0–33.0)
MCHC: 33.8 g/dL (ref 32.0–36.0)
MCV: 96.3 fL (ref 80.0–100.0)
MPV: 11.4 fL (ref 7.5–12.5)
Monocytes Relative: 7.1 %
Neutro Abs: 3266 cells/uL (ref 1500–7800)
Neutrophils Relative %: 62.8 %
Platelets: 231 10*3/uL (ref 140–400)
RBC: 4.36 10*6/uL (ref 3.80–5.10)
RDW: 12 % (ref 11.0–15.0)
Total Lymphocyte: 27.2 %
WBC: 5.2 10*3/uL (ref 3.8–10.8)

## 2020-02-24 LAB — IRON,TIBC AND FERRITIN PANEL
%SAT: 45 % (calc) (ref 16–45)
Ferritin: 24 ng/mL (ref 16–232)
Iron: 142 ug/dL (ref 40–190)
TIBC: 314 mcg/dL (calc) (ref 250–450)

## 2020-02-24 LAB — TSH: TSH: 0.87 mIU/L

## 2020-02-24 LAB — FSH/LH
FSH: 9.3 m[IU]/mL
LH: 11.6 m[IU]/mL

## 2020-03-03 ENCOUNTER — Telehealth: Payer: Self-pay | Admitting: Obstetrics & Gynecology

## 2020-03-03 NOTE — Telephone Encounter (Signed)

## 2020-03-07 ENCOUNTER — Encounter: Payer: Self-pay | Admitting: Obstetrics & Gynecology

## 2020-03-07 ENCOUNTER — Ambulatory Visit (INDEPENDENT_AMBULATORY_CARE_PROVIDER_SITE_OTHER): Payer: 59 | Admitting: Obstetrics & Gynecology

## 2020-03-07 ENCOUNTER — Other Ambulatory Visit: Payer: Self-pay

## 2020-03-07 VITALS — BP 98/64 | HR 67 | Ht 62.0 in | Wt 130.0 lb

## 2020-03-07 DIAGNOSIS — Z3009 Encounter for other general counseling and advice on contraception: Secondary | ICD-10-CM

## 2020-03-07 DIAGNOSIS — N930 Postcoital and contact bleeding: Secondary | ICD-10-CM | POA: Diagnosis not present

## 2020-03-07 DIAGNOSIS — N941 Unspecified dyspareunia: Secondary | ICD-10-CM

## 2020-03-07 MED ORDER — NORETHIN ACE-ETH ESTRAD-FE 1-20 MG-MCG(24) PO TABS: 1.0000 | ORAL_TABLET | Freq: Every day | ORAL | 11 refills | Status: DC

## 2020-03-07 NOTE — Progress Notes (Signed)
Chief Complaint  Patient presents with  . Vaginal Bleeding    bleeding after sex      44 y.o. G3P3003 Patient's last menstrual period was 02/13/2020. The current method of family planning is none.  Outpatient Encounter Medications as of 03/07/2020  Medication Sig  . cetirizine (ZYRTEC) 10 MG tablet Take 1 tablet (10 mg total) by mouth daily as needed for allergies.  . Norethindrone Acetate-Ethinyl Estrad-FE (LOESTRIN 24 FE) 1-20 MG-MCG(24) tablet Take 1 tablet by mouth daily.  . polyethylene glycol powder (GLYCOLAX/MIRALAX) 17 GM/SCOOP powder Take 17 g by mouth daily. (Patient not taking: Reported on 03/07/2020)   No facility-administered encounter medications on file as of 03/07/2020.    Subjective Pt had heavy bleeding just after intercourse 2 weeks ago, which had never happened before No pain associated  Has chronic bump dysapreunia History reviewed. No pertinent past medical history.  Past Surgical History:  Procedure Laterality Date  . WISDOM TOOTH EXTRACTION      OB History    Gravida  3   Para  3   Term  3   Preterm      AB      Living  3     SAB      TAB      Ectopic      Multiple      Live Births              No Known Allergies  Social History   Socioeconomic History  . Marital status: Legally Separated    Spouse name: Not on file  . Number of children: 3  . Years of education: Not on file  . Highest education level: Not on file  Occupational History    Employer: [employer]  Tobacco Use  . Smoking status: Never Smoker  . Smokeless tobacco: Never Used  Substance and Sexual Activity  . Alcohol use: Not Currently    Comment: rare  . Drug use: No  . Sexual activity: Yes    Birth control/protection: None  Other Topics Concern  . Not on file  Social History Narrative   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  Works as as a Media planner.     Social Determinants of Health   Financial Resource Strain:   . Difficulty of Paying  Living Expenses:   Food Insecurity:   . Worried About Programme researcher, broadcasting/film/video in the Last Year:   . Barista in the Last Year:   Transportation Needs:   . Freight forwarder (Medical):   Marland Kitchen Lack of Transportation (Non-Medical):   Physical Activity:   . Days of Exercise per Week:   . Minutes of Exercise per Session:   Stress:   . Feeling of Stress :   Social Connections:   . Frequency of Communication with Friends and Family:   . Frequency of Social Gatherings with Friends and Family:   . Attends Religious Services:   . Active Member of Clubs or Organizations:   . Attends Banker Meetings:   Marland Kitchen Marital Status:     Family History  Problem Relation Age of Onset  . Anxiety disorder Mother   . Arthritis Mother   . Asthma Mother   . COPD Mother   . Depression Mother   . Heart disease Mother   . Hypertension Mother   . Cancer Father        Colon cancer   . Diabetes Sister   . Diabetes  Maternal Grandmother   . Arthritis Maternal Grandmother   . Asthma Maternal Grandmother   . COPD Maternal Grandmother   . Heart disease Maternal Grandmother   . COPD Paternal Grandfather   . Hypertension Sister   . Stroke Neg Hx     Medications:       Current Outpatient Medications:  .  cetirizine (ZYRTEC) 10 MG tablet, Take 1 tablet (10 mg total) by mouth daily as needed for allergies., Disp: 30 tablet, Rfl: 11 .  Norethindrone Acetate-Ethinyl Estrad-FE (LOESTRIN 24 FE) 1-20 MG-MCG(24) tablet, Take 1 tablet by mouth daily., Disp: 1 Package, Rfl: 11 .  polyethylene glycol powder (GLYCOLAX/MIRALAX) 17 GM/SCOOP powder, Take 17 g by mouth daily. (Patient not taking: Reported on 03/07/2020), Disp: 3350 g, Rfl: 1  Objective Blood pressure 98/64, pulse 67, height 5\' 2"  (1.575 m), weight 130 lb (59 kg), last menstrual period 02/13/2020.  General WDWN female NAD Vulva:  normal appearing vulva with no masses, tenderness or lesions Vagina:  normal mucosa, no discharge, no evidence  of laceration no abnormalities noted Cervix:  Normal no lesions Uterus:  normal size, contour, position, consistency, mobility, non-tender Adnexa: ovaries:present,  normal adnexa in size, nontender and no masses   Pertinent ROS No burning with urination, frequency or urgency No nausea, vomiting or diarrhea Nor fever chills or other constitutional symptoms   Labs or studies     Impression Diagnoses this Encounter::   ICD-10-CM   1. PCB (post coital bleeding)  N93.0   2. Dyspareunia, female  N94.10   3. Counseling for birth control, oral contraceptives  Z30.09     Established relevant diagnosis(es):   Plan/Recommendations: Meds ordered this encounter  Medications  . Norethindrone Acetate-Ethinyl Estrad-FE (LOESTRIN 24 FE) 1-20 MG-MCG(24) tablet    Sig: Take 1 tablet by mouth daily.    Dispense:  1 Package    Refill:  11    Labs or Scans Ordered: No orders of the defined types were placed in this encounter.   Management:: >begin OCP for contraception, lower dose, 24 pill pak >no evidence of vaginal laceration but that is most likely cause of her post coital bleeding  Follow up No follow-ups on file.      All questions were answered.

## 2020-04-13 ENCOUNTER — Other Ambulatory Visit: Payer: Self-pay | Admitting: Family Medicine

## 2020-06-07 ENCOUNTER — Other Ambulatory Visit: Payer: Self-pay

## 2020-06-07 ENCOUNTER — Ambulatory Visit: Payer: 59 | Admitting: Family Medicine

## 2020-06-07 ENCOUNTER — Encounter: Payer: Self-pay | Admitting: Family Medicine

## 2020-06-07 VITALS — BP 110/62 | HR 68 | Temp 98.7°F | Resp 14 | Ht 62.0 in | Wt 132.0 lb

## 2020-06-07 DIAGNOSIS — M75101 Unspecified rotator cuff tear or rupture of right shoulder, not specified as traumatic: Secondary | ICD-10-CM

## 2020-06-07 DIAGNOSIS — M542 Cervicalgia: Secondary | ICD-10-CM

## 2020-06-07 DIAGNOSIS — M2550 Pain in unspecified joint: Secondary | ICD-10-CM

## 2020-06-07 DIAGNOSIS — M25551 Pain in right hip: Secondary | ICD-10-CM

## 2020-06-07 DIAGNOSIS — M79641 Pain in right hand: Secondary | ICD-10-CM

## 2020-06-07 DIAGNOSIS — M79642 Pain in left hand: Secondary | ICD-10-CM

## 2020-06-07 DIAGNOSIS — G8929 Other chronic pain: Secondary | ICD-10-CM

## 2020-06-07 DIAGNOSIS — M25561 Pain in right knee: Secondary | ICD-10-CM

## 2020-06-07 MED ORDER — DICLOFENAC SODIUM 75 MG PO TBEC: 75.0000 mg | DELAYED_RELEASE_TABLET | Freq: Two times a day (BID) | ORAL | 1 refills | Status: DC

## 2020-06-07 NOTE — Patient Instructions (Addendum)
Get xrays done at [facility]  We will call with lab results  Try the diclofenac  F/U pending results

## 2020-06-07 NOTE — Progress Notes (Signed)
Subjective:    Patient ID: Cheyenne Bolton, female    DOB: [birth year], 44 y.o.   MRN: [id number]  Patient presents for Joint Pain (x2 years- intermittent B hand pain in joints- loosing grips in B hands- toes/ knees stiff / spine and neck pain)  Patient here with polyarthralgia.  She has had intermittent joint pain for many years ( No injury).  States she had a severe episode when her daughter was young but that was about 10 years ago.  She never had full evaluation at that time.  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  She gets swelling and stiffness of her hands or toes her right hip there is always pain into the groin.  She also has pain on her cervical spine if you touch it at the base of the skull she will have significant tenderness most days.  Denies any radicular symptoms or tingling numbness in the hands or feet.  She does take Advil but that is no longer helping.  She has not noted any effusions of the knees or elbows.  No history of gout.  She does have family history of arthritis but she is not sure if it is autoimmune or not.   Review Of Systems:  GEN- denies fatigue, fever, weight loss,weakness, recent illness HEENT- denies eye drainage, change in vision, nasal discharge, CVS- denies chest pain, palpitations RESP- denies SOB, cough, wheeze ABD- denies N/V, change in stools, abd pain GU- denies dysuria, hematuria, dribbling, incontinence MSK- + joint pain, muscle aches, injury Neuro- denies headache, dizziness, syncope, seizure activity       Objective:    BP 110/62   Pulse 68   Temp 98.7 F (37.1 C) (Temporal)   Resp 14   Ht 5\' 2"  (1.575 m)   Wt 132 lb (59.9 kg)   SpO2 98%   BMI 24.14 kg/m  GEN- NAD, alert and oriented x3 CVS- RRR, no murmur RESP-CTAB MSK- FROM upper and lower ext, no joint effusions normal  grasp bilat hands, able to make fist, no nodules on hands Mild pain wit right empty can rotator cuff grossly in tact, biceps in tact C spine decreased ROM with rotation to right, mild TTP base of spine Good ROM hips/knees/ankles, no effusion noted  EXT- No edema Pulses- Radial, DP- 2+        Assessment & Plan:      Problem List Items Addressed This Visit    None    Visit Diagnoses    Polyarthralgia    -  Primary   Multiple joints involved, check for autoimmune arthritis, xrays to be obtained, overuse syndrome with rotator on right shoulder but intact, trial of diclofenac for inflammation Based on results may need rheumatology vs orthopedics    Relevant Orders   Sedimentation Rate   C-reactive protein   ANA   Rheumatoid factor   Bilateral hand pain       Relevant Orders   DG Hand Complete Right   DG Hand Complete Left   Chronic neck pain       Relevant Medications   diclofenac (VOLTAREN) 75 MG EC tablet   Other Relevant Orders   DG Cervical Spine Complete   Rotator cuff syndrome, right       Relevant Orders   DG Shoulder Right   Chronic  right hip pain       Relevant Medications   diclofenac (VOLTAREN) 75 MG EC tablet   Other Relevant Orders   DG Hip Unilat W OR W/O Pelvis 2-3 Views Right   Chronic pain of right knee       Relevant Medications   diclofenac (VOLTAREN) 75 MG EC tablet   Other Relevant Orders   DG Knee Complete 4 Views Right      Note: This dictation was prepared with Dragon dictation along with smaller phrase technology. Any transcriptional errors that result from this process are unintentional.

## 2020-06-08 ENCOUNTER — Ambulatory Visit
Admission: RE | Admit: 2020-06-08 | Discharge: 2020-06-08 | Disposition: A | Discharge status: Home / Self Care | Payer: 59 | Source: Ambulatory Visit | Attending: Family Medicine | Admitting: Family Medicine

## 2020-06-08 ENCOUNTER — Other Ambulatory Visit: Payer: Self-pay | Admitting: Family Medicine

## 2020-06-08 ENCOUNTER — Other Ambulatory Visit: Payer: Self-pay | Admitting: *Deleted

## 2020-06-08 DIAGNOSIS — M2550 Pain in unspecified joint: Secondary | ICD-10-CM

## 2020-06-08 DIAGNOSIS — M25551 Pain in right hip: Secondary | ICD-10-CM

## 2020-06-08 DIAGNOSIS — M79641 Pain in right hand: Secondary | ICD-10-CM

## 2020-06-08 DIAGNOSIS — M75101 Unspecified rotator cuff tear or rupture of right shoulder, not specified as traumatic: Secondary | ICD-10-CM

## 2020-06-08 DIAGNOSIS — G8929 Other chronic pain: Secondary | ICD-10-CM

## 2020-06-08 DIAGNOSIS — M25561 Pain in right knee: Secondary | ICD-10-CM

## 2020-06-08 LAB — SEDIMENTATION RATE: Sed Rate: 2 mm/h (ref 0–20)

## 2020-06-08 LAB — C-REACTIVE PROTEIN: CRP: 1.9 mg/L (ref <8.0)

## 2020-06-08 LAB — RHEUMATOID FACTOR: Rheumatoid fact SerPl-aCnc: <14 IU/mL (ref <14)

## 2020-06-08 LAB — ANA: Anti Nuclear Antibody (ANA): NEGATIVE

## 2020-09-26 ENCOUNTER — Telehealth: Payer: Self-pay | Admitting: Family Medicine

## 2020-09-26 NOTE — Telephone Encounter (Signed)
Call placed to patient.   Reports 7-10 days of body aches, sinus pressure and nasal congestion.   Advised to go to UC for sinus Sx and she can have COVID test there.

## 2020-09-26 NOTE — Telephone Encounter (Signed)
CB# [phone] Having body aching, congestion would like to be tested for Covid

## 2020-09-27 ENCOUNTER — Other Ambulatory Visit: Payer: Self-pay

## 2020-09-27 ENCOUNTER — Ambulatory Visit: Payer: 59 | Admitting: *Deleted

## 2020-09-27 DIAGNOSIS — Z20822 Contact with and (suspected) exposure to covid-19: Secondary | ICD-10-CM

## 2020-09-27 NOTE — Telephone Encounter (Signed)
Agree with covid swabbing, does not need appt, since no significant respiratory symptoms

## 2020-09-27 NOTE — Telephone Encounter (Signed)
Received call from patient.   Reports that home COVID test is positive. States that congestion has reduced, but now she has loss of taste.   States that she requires "real" COVID test for her job.   Advised to stop by for swab.

## 2020-09-27 NOTE — Patient Instructions (Addendum)
The COVID PCR test will take 48 hours to result. Once resulted, we will contact you with the provider recommendations.   Continue to self isolate/ quarantine until the results are complotted.   Use over the counter medication for symptom management: Robitussin/ Mucinex for cough Nasal saline for nasal congestion Tylenol for fever  If shortness of breath presents or worsens, go to ER.  If you begin to experience chest pain, go to ER. If your fever will not reduce with tylenol, go to the ER.

## 2020-09-28 LAB — SARS-COV-2 RNA,(COVID-19) QUALITATIVE NAAT: SARS CoV2 RNA: DETECTED — CR

## 2021-01-30 ENCOUNTER — Other Ambulatory Visit: Payer: Self-pay | Admitting: Family Medicine

## 2021-01-30 DIAGNOSIS — Z1231 Encounter for screening mammogram for malignant neoplasm of breast: Secondary | ICD-10-CM

## 2021-02-20 ENCOUNTER — Encounter: Payer: Self-pay | Admitting: Family Medicine

## 2021-02-25 ENCOUNTER — Other Ambulatory Visit: Payer: Self-pay | Admitting: Family Medicine

## 2021-02-27 ENCOUNTER — Encounter: Payer: Self-pay | Admitting: Family Medicine

## 2021-04-06 ENCOUNTER — Encounter: Payer: Self-pay | Admitting: Nurse Practitioner

## 2021-04-06 ENCOUNTER — Other Ambulatory Visit: Payer: Self-pay | Admitting: Nurse Practitioner

## 2021-04-06 MED ORDER — POLYETHYLENE GLYCOL 3350 17 GM/SCOOP PO POWD: 17.0000 g | Freq: Every day | ORAL | 1 refills | Status: DC

## 2021-04-26 ENCOUNTER — Ambulatory Visit: Payer: 59

## 2021-05-01 ENCOUNTER — Ambulatory Visit: Payer: 59

## 2021-11-07 ENCOUNTER — Ambulatory Visit (HOSPITAL_COMMUNITY)
Admission: EM | Admit: 2021-11-07 | Discharge: 2021-11-07 | Disposition: A | Discharge status: Home / Self Care | Payer: 59 | Attending: Family Medicine | Admitting: Family Medicine

## 2021-11-07 ENCOUNTER — Other Ambulatory Visit: Payer: Self-pay

## 2021-11-07 ENCOUNTER — Encounter (HOSPITAL_COMMUNITY): Payer: Self-pay | Admitting: Emergency Medicine

## 2021-11-07 DIAGNOSIS — J069 Acute upper respiratory infection, unspecified: Secondary | ICD-10-CM

## 2021-11-07 DIAGNOSIS — J101 Influenza due to other identified influenza virus with other respiratory manifestations: Secondary | ICD-10-CM | POA: Insufficient documentation

## 2021-11-07 DIAGNOSIS — R509 Fever, unspecified: Secondary | ICD-10-CM | POA: Diagnosis not present

## 2021-11-07 DIAGNOSIS — Z20822 Contact with and (suspected) exposure to covid-19: Secondary | ICD-10-CM | POA: Diagnosis not present

## 2021-11-07 LAB — RESPIRATORY PANEL BY PCR

## 2021-11-07 MED ORDER — PROMETHAZINE-DM 6.25-15 MG/5ML PO SYRP
5.0000 mL | ORAL_SOLUTION | Freq: Four times a day (QID) | ORAL | 0 refills | Status: DC | PRN
Start: 2021-11-07 — End: 2023-01-24

## 2021-11-07 MED ORDER — OSELTAMIVIR PHOSPHATE 75 MG PO CAPS: 75.0000 mg | ORAL_CAPSULE | Freq: Two times a day (BID) | ORAL | 0 refills | Status: DC

## 2021-11-07 NOTE — ED Triage Notes (Signed)
Pt is present today with cough, congestion, sore throat, and fever. Pt sx started Sunday night

## 2021-11-07 NOTE — ED Provider Notes (Signed)
[facility]    CSN: [id number] Arrival date & time: 11/07/21  1739      History   Chief Complaint Chief Complaint  Patient presents with   Cough   Fever   Sore Throat   Nasal Congestion    HPI Cheyenne Bolton is a 45 y.o. female.   Presenting today with 2-day history of cough, congestion, sore throat, fever, fatigue, body aches, chills.  Denies chest pain, shortness of breath, abdominal pain, nausea vomiting or diarrhea.  So far trying over-the-counter cold and congestion medications, fever reducers with minimal temporary relief.  Numerous sick contacts recently at home.  Does have a history of seasonal allergies on antihistamines daily, no known pulmonary diseases.   History reviewed. No pertinent past medical history.  Patient Active Problem List   Diagnosis Date Noted   Eczematous dermatitis 09/16/2012   Exposure to hepatitis 07/31/2012   Leg cramps 07/24/2012   Anxiety and depression 01/21/2008   KNEE PAIN, CHRONIC 05/02/2007   MIGRAINE, UNSPEC., W/O INTRACTABLE MIGRAINE 02/06/2007   RHINITIS, ALLERGIC 02/06/2007    Past Surgical History:  Procedure Laterality Date   WISDOM TOOTH EXTRACTION      OB History     Gravida  3   Para  3   Term  3   Preterm      AB      Living  3      SAB      IAB      Ectopic      Multiple      Live Births               Home Medications    Prior to Admission medications   Medication Sig Start Date End Date Taking? Authorizing Provider  oseltamivir (TAMIFLU) 75 MG capsule Take 1 capsule (75 mg total) by mouth every 12 (twelve) hours. 11/07/21  Yes Particia Nearing, PA-C  promethazine-dextromethorphan (PROMETHAZINE-DM) 6.25-15 MG/5ML syrup Take 5 mLs by mouth 4 (four) times daily as needed. 11/07/21  Yes Particia Nearing, PA-C  cetirizine (ZYRTEC) 10 MG tablet Take 1 tablet (10 mg total) by mouth daily as needed for allergies. 04/23/18   [place], Velna Hatchet, MD  diclofenac (VOLTAREN)  75 MG EC tablet Take 1 tablet (75 mg total) by mouth 2 (two) times daily. 06/07/20   [place], Velna Hatchet, MD  Norethindrone Acetate-Ethinyl Estrad-FE (LOESTRIN 24 FE) 1-20 MG-MCG(24) tablet Take 1 tablet by mouth daily. 03/07/20   Lazaro Arms, MD  polyethylene glycol powder (GLYCOLAX/MIRALAX) 17 GM/SCOOP powder Take 17 g by mouth daily. 04/06/21   Valentino Nose, NP    Family History Family History  Problem Relation Age of Onset   Anxiety disorder Mother    Arthritis Mother    Asthma Mother    COPD Mother    Depression Mother    Heart disease Mother    Hypertension Mother    Cancer Father        Colon cancer    Diabetes Sister    Diabetes Maternal Grandmother    Arthritis Maternal Grandmother    Asthma Maternal Grandmother    COPD Maternal Grandmother    Heart disease Maternal Grandmother    COPD Paternal Grandfather    Hypertension Sister    Stroke Neg Hx     Social History Social History   Tobacco Use   Smoking status: Never   Smokeless tobacco: Never  Vaping Use   Vaping Use: Never used  Substance  Use Topics   Alcohol use: Not Currently    Comment: rare   Drug use: No     Allergies   Patient has no known allergies.   Review of Systems Review of Systems Per HPI  Physical Exam Triage Vital Signs ED Triage Vitals  Enc Vitals Group     BP 11/07/21 1842 117/73     Pulse Rate 11/07/21 1842 74     Resp 11/07/21 1842 17     Temp 11/07/21 1842 98.4 F (36.9 C)     Temp Source 11/07/21 1842 Oral     SpO2 11/07/21 1842 100 %     Weight --      Height --      Head Circumference --      Peak Flow --      Pain Score 11/07/21 1841 0     Pain Loc --      Pain Edu? --      Excl. in GC? --    No data found.  Updated Vital Signs BP 117/73 (BP Location: Right Arm)   Pulse 74   Temp 98.4 F (36.9 C) (Oral)   Resp 17   SpO2 100%   Visual Acuity Right Eye Distance:   Left Eye Distance:   Bilateral Distance:    Right Eye Near:   Left Eye Near:     Bilateral Near:     Physical Exam Vitals and nursing note reviewed.  Constitutional:      Appearance: Normal appearance.  HENT:     Head: Atraumatic.     Right Ear: Tympanic membrane and external ear normal.     Left Ear: Tympanic membrane and external ear normal.     Nose: Rhinorrhea present.     Mouth/Throat:     Mouth: Mucous membranes are moist.     Pharynx: Posterior oropharyngeal erythema present.  Eyes:     Extraocular Movements: Extraocular movements intact.     Conjunctiva/sclera: Conjunctivae normal.  Cardiovascular:     Rate and Rhythm: Normal rate and regular rhythm.     Heart sounds: Normal heart sounds.  Pulmonary:     Effort: Pulmonary effort is normal.     Breath sounds: Normal breath sounds. No wheezing.  Musculoskeletal:        General: Normal range of motion.     Cervical back: Normal range of motion and neck supple.  Skin:    General: Skin is warm and dry.  Neurological:     Mental Status: She is alert and oriented to person, place, and time.  Psychiatric:        Mood and Affect: Mood normal.        Thought Content: Thought content normal.     UC Treatments / Results  Labs (all labs ordered are listed, but only abnormal results are displayed) Labs Reviewed  SARS CORONAVIRUS 2 (TAT 6-24 HRS)  RESPIRATORY PANEL BY PCR    EKG   Radiology No results found.  Procedures Procedures (including critical care time)  Medications Ordered in UC Medications - No data to display  Initial Impression / Assessment and Plan / UC Course  I have reviewed the triage vital signs and the nursing notes.  Pertinent labs & imaging results that were available during my care of the patient were reviewed by me and considered in my medical decision making (see chart for details).     Overall vital signs reassuring, exam indicative of a viral upper respiratory infection.  Suspect  influenza based on symptoms and community exposures.  We will proactively start  Tamiflu while awaiting respiratory panel, COVID PCR testing.  Phenergan DM sent for symptomatic benefit and discussed over-the-counter supportive medications and home care.  Work note given.  Return for worsening symptoms.  Final Clinical Impressions(s) / UC Diagnoses   Final diagnoses:  Viral URI with cough  Fever, unspecified fever cause   Discharge Instructions   None    ED Prescriptions     Medication Sig Dispense Auth. Provider   oseltamivir (TAMIFLU) 75 MG capsule Take 1 capsule (75 mg total) by mouth every 12 (twelve) hours. 10 capsule Particia Nearing, [place]   promethazine-dextromethorphan (PROMETHAZINE-DM) 6.25-15 MG/5ML syrup Take 5 mLs by mouth 4 (four) times daily as needed. 100 mL Particia Nearing, [place]      PDMP not reviewed this encounter.   Particia Nearing, [place] 11/07/21 1945

## 2021-11-08 LAB — SARS CORONAVIRUS 2 (TAT 6-24 HRS): SARS Coronavirus 2: NEGATIVE

## 2021-11-21 ENCOUNTER — Telehealth: Payer: Self-pay

## 2021-11-21 NOTE — Telephone Encounter (Signed)
Patient has a new grandbaby on the way and would like to get an updated Tdap.  Please advise.

## 2021-11-22 NOTE — Telephone Encounter (Signed)
Left detailed message making patient aware of need for physical and appointment.  Mychart message also sent.  Patient advised to call if unable to do physical and vaccines in same day.

## 2021-11-22 NOTE — Telephone Encounter (Signed)
That is fine; she is also due for a CPE, please make her aware and assist with scheduling

## 2021-11-24 ENCOUNTER — Encounter: Payer: 59 | Admitting: Nurse Practitioner

## 2021-11-28 ENCOUNTER — Ambulatory Visit (INDEPENDENT_AMBULATORY_CARE_PROVIDER_SITE_OTHER): Payer: 59

## 2021-11-28 ENCOUNTER — Other Ambulatory Visit: Payer: Self-pay

## 2021-11-28 DIAGNOSIS — Z23 Encounter for immunization: Secondary | ICD-10-CM | POA: Diagnosis not present

## 2021-11-28 NOTE — Progress Notes (Signed)
Patient seen today for Tdap vaccine.  She has new grandbaby and wants updated.  Patient tolerated well.

## 2021-11-29 ENCOUNTER — Ambulatory Visit: Payer: 59

## 2022-10-24 ENCOUNTER — Ambulatory Visit (INDEPENDENT_AMBULATORY_CARE_PROVIDER_SITE_OTHER): Payer: Commercial Managed Care - HMO | Admitting: Family Medicine

## 2022-10-24 ENCOUNTER — Encounter: Payer: Self-pay | Admitting: Family Medicine

## 2022-10-24 ENCOUNTER — Telehealth: Payer: Self-pay | Admitting: Family Medicine

## 2022-10-24 VITALS — BP 115/90 | HR 83 | Temp 97.8°F | Ht 62.0 in | Wt 135.0 lb

## 2022-10-24 DIAGNOSIS — J069 Acute upper respiratory infection, unspecified: Secondary | ICD-10-CM | POA: Diagnosis not present

## 2022-10-24 DIAGNOSIS — Z1211 Encounter for screening for malignant neoplasm of colon: Secondary | ICD-10-CM | POA: Diagnosis not present

## 2022-10-24 DIAGNOSIS — K5904 Chronic idiopathic constipation: Secondary | ICD-10-CM | POA: Diagnosis not present

## 2022-10-24 DIAGNOSIS — Z23 Encounter for immunization: Secondary | ICD-10-CM | POA: Diagnosis not present

## 2022-10-24 DIAGNOSIS — Z0000 Encounter for general adult medical examination without abnormal findings: Secondary | ICD-10-CM | POA: Diagnosis not present

## 2022-10-24 NOTE — Telephone Encounter (Signed)
Patient has Medical sales representative for insurance, ID #[id number]; effective 07/10/22.Contact number for Rosann Auerbach is [phone].  Patient stated insurance company isn't sending out a hard copy of the insurance card; patient hasn't received the electronic copy of the card yet.  Advised patient to send [place] a copy of the electronic insurance card for [employer] via MyChart when she receives it.   She will be changing her insurance in 2024.

## 2022-10-24 NOTE — Progress Notes (Signed)
New Patient Office Visit  Subjective    Patient ID: Cheyenne Bolton, female    DOB: [birth year]  Age: 46 y.o. MRN: [id number]  CC:  Chief Complaint  Patient presents with   Establish Care    Referral to GI/colonoscopy Congestion/grandkids had RSV for over a month     HPI Cheyenne Bolton presents to establish care. Oriented to practice routines and expectations. PMH of constipation and allergies. Requests referral for GI and colonoscopy for constipation and chronic hemorrhoids. Family history of colon ca. Has tried high fiber diet and Miralax for years. Reports eating 2 regular meals a day. Denies blood in stool or rectal bleeding, anorexia, nausea, vomiting, or weight loss. Reports Bristol stool pattern 3-5 with Miralax.  Reports 4-5 days watery rhinorrhea, congestion, malaise, sore throat Denies fevers, cough Has tried OTC sudafed and cough syrup Exposed to RSV  Physical and labs today STI declines Mammo scheduled for December Cervical screening scheduled for December Colonoscopy will order Tobacco denies Drugs/ETOH denies Immunizations: flu today    Outpatient Encounter Medications as of 10/24/2022  Medication Sig   cetirizine (ZYRTEC) 10 MG tablet Take 1 tablet (10 mg total) by mouth daily as needed for allergies.   polyethylene glycol powder (GLYCOLAX/MIRALAX) 17 GM/SCOOP powder Take 17 g by mouth daily.   diclofenac (VOLTAREN) 75 MG EC tablet Take 1 tablet (75 mg total) by mouth 2 (two) times daily. (Patient not taking: Reported on 10/24/2022)   Norethindrone Acetate-Ethinyl Estrad-FE (LOESTRIN 24 FE) 1-20 MG-MCG(24) tablet Take 1 tablet by mouth daily. (Patient not taking: Reported on 10/24/2022)   oseltamivir (TAMIFLU) 75 MG capsule Take 1 capsule (75 mg total) by mouth every 12 (twelve) hours. (Patient not taking: Reported on 10/24/2022)   promethazine-dextromethorphan (PROMETHAZINE-DM) 6.25-15 MG/5ML syrup Take 5 mLs by mouth 4 (four) times daily as needed. (Patient  not taking: Reported on 10/24/2022)   No facility-administered encounter medications on file as of 10/24/2022.    History reviewed. No pertinent past medical history.  Past Surgical History:  Procedure Laterality Date   WISDOM TOOTH EXTRACTION      Family History  Problem Relation Age of Onset   Anxiety disorder Mother    Arthritis Mother    Asthma Mother    COPD Mother    Depression Mother    Heart disease Mother    Hypertension Mother    Cancer Father        Colon cancer    Diabetes Sister    Diabetes Maternal Grandmother    Arthritis Maternal Grandmother    Asthma Maternal Grandmother    COPD Maternal Grandmother    Heart disease Maternal Grandmother    COPD Paternal Grandfather    Hypertension Sister    Stroke Neg Hx     Social History   Socioeconomic History   Marital status: Divorced    Spouse name: Not on file   Number of children: 3   Years of education: Not on file   Highest education level: Not on file  Occupational History    Employer: Oncologist  Tobacco Use   Smoking status: Never   Smokeless tobacco: Never  Vaping Use   Vaping Use: Never used  Substance and Sexual Activity   Alcohol use: Not Currently    Comment: rare   Drug use: No   Sexual activity: Yes    Birth control/protection: None  Other Topics Concern   Not on file  Social History Narrative   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  Works as as a Media planner.     Social Determinants of Health   Financial Resource Strain: Low Risk  (04/23/2018)   Overall Financial Resource Strain (CARDIA)    Difficulty of Paying Living Expenses: Not hard at all  Food Insecurity: No Food Insecurity (04/23/2018)   Hunger Vital Sign    Worried About Running Out of Food in the Last Year: Never true    Ran Out of Food in the Last Year: Never true  Transportation Needs: No Transportation Needs (04/23/2018)   PRAPARE - Administrator, Civil Service (Medical): No    Lack of Transportation  (Non-Medical): No  Physical Activity: Insufficiently Active (04/23/2018)   Exercise Vital Sign    Days of Exercise per Week: 3 days    Minutes of Exercise per Session: 20 min  Stress: Stress Concern Present (04/23/2018)   [employer] of Occupational Health - Occupational Stress Questionnaire    Feeling of Stress : To some extent  Social Connections: Somewhat Isolated (04/23/2018)   Social Connection and Isolation Panel [NHANES]    Frequency of Communication with Friends and Family: More than three times a week    Frequency of Social Gatherings with Friends and Family: More than three times a week    Attends Religious Services: More than 4 times per year    Active Member of [employer] or Organizations: No    Attends Banker Meetings: Never    Marital Status: Separated  Intimate Partner Violence: Not At Risk (04/23/2018)   Humiliation, Afraid, Rape, and Kick questionnaire    Fear of Current or Ex-Partner: No    Emotionally Abused: No    Physically Abused: No    Sexually Abused: No    Review of Systems  Constitutional:  Positive for malaise/fatigue.  HENT:  Positive for congestion and sore throat.   Eyes: Negative.        Nearsightedness  Respiratory: Negative.    Cardiovascular: Negative.   Gastrointestinal:  Positive for constipation. Negative for blood in stool.       Hemorrhoids  Genitourinary: Negative.   Musculoskeletal: Negative.   Skin: Negative.   Neurological: Negative.   Endo/Heme/Allergies: Negative.   Psychiatric/Behavioral: Negative.          Objective    BP (!) 115/90   Pulse 83   Temp 97.8 F (36.6 C) (Oral)   Ht 5\' 2"  (1.575 m)   Wt 135 lb (61.2 kg)   LMP 10/04/2022 (Exact Date)   SpO2 100%   BMI 24.69 kg/m   Physical Exam Vitals and nursing note reviewed.  Constitutional:      Appearance: Normal appearance. She is normal weight.  HENT:     Head: Normocephalic and atraumatic.     Right Ear: Tympanic membrane, ear canal and  external ear normal. There is impacted cerumen.     Left Ear: Tympanic membrane, ear canal and external ear normal.     Nose: Nose normal.     Mouth/Throat:     Mouth: Mucous membranes are moist.     Pharynx: Oropharynx is clear.  Eyes:     Extraocular Movements: Extraocular movements intact.     Conjunctiva/sclera: Conjunctivae normal.     Pupils: Pupils are equal, round, and reactive to light.  Cardiovascular:     Rate and Rhythm: Normal rate and regular rhythm.     Pulses: Normal pulses.     Heart sounds: Normal heart sounds.  Pulmonary:     Effort: Pulmonary effort  is normal.     Breath sounds: Normal breath sounds.  Abdominal:     General: Bowel sounds are normal.     Palpations: Abdomen is soft.  Musculoskeletal:        General: Normal range of motion.     Cervical back: Normal range of motion and neck supple.  Skin:    General: Skin is warm and dry.     Capillary Refill: Capillary refill takes less than 2 seconds.  Neurological:     General: No focal deficit present.     Mental Status: She is alert and oriented to person, place, and time. Mental status is at baseline.  Psychiatric:        Mood and Affect: Mood normal.        Behavior: Behavior normal.        Thought Content: Thought content normal.        Judgment: Judgment normal.        Assessment & Plan:   1. Need for immunization against influenza - Flu Vaccine QUAD 6+ mos PF IM (Fluarix Quad PF)  2. Physical exam, annual Routine fasting labs done today and complete physical. Health maintenance updated with requested colonoscopy ordered.  - CBC with Differential/Platelet - COMPLETE METABOLIC PANEL WITH GFR - Lipid panel  3. Colon cancer screening   4. Chronic idiopathic constipation Encouraged to increase fiber intake. Continue Miralax. Seek medical attention for inability to have BM associated with nausea and vomiting, severe abdominal pain. - Ambulatory referral to Gastroenterology  5. Viral  URI Acute for 4 days. Will swab for RSV, flu, covid. Reassured patient that findings are consistent with viral upper respiratory infection and explained lack of efficacy of antibiotics against viruses. Discussed expected course and features suggestive of secondary bacterial infection. Continue supportive care, increase fluid intake, and rest. Encouraged OTC guaifenesin or delsym. Return to clinic in 7 days if no improvement.  - SARS-CoV-2 RNA, Influenza A/B, and RSV RNA, Qualitative NAAT   Return in about 1 year (around 10/25/2023) for annual physical.   Park Meo, FNP

## 2022-10-24 NOTE — Patient Instructions (Addendum)
Start OTC Metamucil and increase dietary fiber

## 2022-10-25 LAB — SARS-COV-2 RNA, INFLUENZA A/B, AND RSV RNA, QUALITATIVE NAAT
INFLUENZA A RNA: NOT DETECTED
INFLUENZA B RNA: NOT DETECTED
RSV RNA: NOT DETECTED
SARS COV2 RNA: NOT DETECTED

## 2022-10-25 LAB — COMPLETE METABOLIC PANEL WITH GFR
AG Ratio: 1.7 (calc) (ref 1.0–2.5)
ALT: 9 U/L (ref 6–29)
AST: 12 U/L (ref 10–35)
Albumin: 4.3 g/dL (ref 3.6–5.1)
Alkaline phosphatase (APISO): 75 U/L (ref 31–125)
BUN: 8 mg/dL (ref 7–25)
CO2: 25 mmol/L (ref 20–32)
Calcium: 9 mg/dL (ref 8.6–10.2)
Chloride: 104 mmol/L (ref 98–110)
Creat: 0.7 mg/dL (ref 0.50–0.99)
Globulin: 2.5 g/dL (calc) (ref 1.9–3.7)
Glucose, Bld: 83 mg/dL (ref 65–99)
Potassium: 3.9 mmol/L (ref 3.5–5.3)
Sodium: 137 mmol/L (ref 135–146)
Total Bilirubin: 1.1 mg/dL (ref 0.2–1.2)
Total Protein: 6.8 g/dL (ref 6.1–8.1)
eGFR: 109 mL/min/{1.73_m2} (ref >=60)

## 2022-10-25 LAB — CBC WITH DIFFERENTIAL/PLATELET
Absolute Monocytes: 447 cells/uL (ref 200–950)
Basophils Absolute: 31 cells/uL (ref 0–200)
Basophils Relative: 0.4 %
Eosinophils Absolute: 62 cells/uL (ref 15–500)
Eosinophils Relative: 0.8 %
HCT: 42.1 % (ref 35.0–45.0)
Hemoglobin: 14.7 g/dL (ref 11.7–15.5)
Lymphs Abs: 1948 cells/uL (ref 850–3900)
MCH: 32 pg (ref 27.0–33.0)
MCHC: 34.9 g/dL (ref 32.0–36.0)
MCV: 91.7 fL (ref 80.0–100.0)
MPV: 11.1 fL (ref 7.5–12.5)
Monocytes Relative: 5.8 %
Neutro Abs: 5213 cells/uL (ref 1500–7800)
Neutrophils Relative %: 67.7 %
Platelets: 262 10*3/uL (ref 140–400)
RBC: 4.59 10*6/uL (ref 3.80–5.10)
RDW: 12 % (ref 11.0–15.0)
Total Lymphocyte: 25.3 %
WBC: 7.7 10*3/uL (ref 3.8–10.8)

## 2022-10-25 LAB — LIPID PANEL
Cholesterol: 117 mg/dL (ref <200)
HDL: 53 mg/dL (ref >=50)
LDL Cholesterol (Calc): 52 mg/dL (calc)
Non-HDL Cholesterol (Calc): 64 mg/dL (calc) (ref <130)
Total CHOL/HDL Ratio: 2.2 (calc) (ref <5.0)
Triglycerides: 50 mg/dL (ref <150)

## 2022-11-14 ENCOUNTER — Encounter: Payer: Self-pay | Admitting: Internal Medicine

## 2022-12-28 ENCOUNTER — Ambulatory Visit: Payer: Commercial Managed Care - HMO | Admitting: Internal Medicine

## 2023-01-24 ENCOUNTER — Ambulatory Visit: Payer: Self-pay | Admitting: Internal Medicine

## 2023-01-24 ENCOUNTER — Ambulatory Visit (INDEPENDENT_AMBULATORY_CARE_PROVIDER_SITE_OTHER): Payer: 59 | Admitting: Internal Medicine

## 2023-01-24 ENCOUNTER — Encounter: Payer: Self-pay | Admitting: Internal Medicine

## 2023-01-24 VITALS — BP 100/60 | HR 72 | Ht 62.0 in | Wt 139.0 lb

## 2023-01-24 DIAGNOSIS — K5900 Constipation, unspecified: Secondary | ICD-10-CM | POA: Diagnosis not present

## 2023-01-24 DIAGNOSIS — K649 Unspecified hemorrhoids: Secondary | ICD-10-CM

## 2023-01-24 DIAGNOSIS — Z1211 Encounter for screening for malignant neoplasm of colon: Secondary | ICD-10-CM

## 2023-01-24 DIAGNOSIS — Z800 Family history of malignant neoplasm of digestive organs: Secondary | ICD-10-CM | POA: Diagnosis not present

## 2023-01-24 MED ORDER — PLENVU 140 G PO SOLR: 1.0000 | Freq: Once | ORAL | 0 refills | Status: AC

## 2023-01-24 NOTE — Patient Instructions (Signed)
_______________________________________________________  If your blood pressure at your visit was 140/90 or greater, please contact your primary care physician to follow up on this.  _______________________________________________________  If you are age 46 or older, your body mass index should be between 23-30. Your Body mass index is 25.42 kg/m. If this is out of the aforementioned range listed, please consider follow up with your Primary Care Provider.  If you are age 63 or younger, your body mass index should be between 19-25. Your Body mass index is 25.42 kg/m. If this is out of the aformentioned range listed, please consider follow up with your Primary Care Provider.   ________________________________________________________  The [facility] providers would like to encourage you to use [facility] to communicate with providers for non-urgent requests or questions.  Due to long hold times on the telephone, sending your provider a message by [facility] may be a faster and more efficient way to get a response.  Please allow 48 business hours for a response.  Please remember that this is for non-urgent requests.  _______________________________________________________  [name] have been scheduled for a colonoscopy. Please follow written instructions given to you at your visit today.  Please pick up your prep supplies at the pharmacy within the next 1-3 days. If you use inhalers (even only as needed), please bring them with you on the day of your procedure.  Drink 8 cups of water daily  Begin a daily fiber supplement

## 2023-01-24 NOTE — Progress Notes (Signed)
Chief Complaint: Colon cancer screening, constipation  HPI : 47 year old female with history of depression and constipation presents with colon cancer screening and constipation.  She has had constipation for years. The constipation has been stable in severity. She has to use Miralax QD in order to induce a BM. If she does not use the laxative, then she will not be able to pass a stool. She does try to stay hydrated but does not drink 8 cups of water per day. If she doesn't take a laxative every day, then the laxative will take a few days to work when she does take it. She has had hemorrhoids since she was 47 years old (the age at which she was first pregnant). For the last 10 years the hemorrhoids have worsened and are now constantly protruding. Denies straining. She is potentially interested in having her hemorrhoids treated with surgery. Denies hematochezia, melena, unintentional weight loss, and rectal pain. Endorses lower abdominal pain when she gets more constipated. She has family history of colon cancer in her father at age 21. Denies N&V and dysphagia. She will have occasional GERD.  Past Medical History:  Diagnosis Date   Depression    Past Surgical History:  Procedure Laterality Date   WISDOM TOOTH EXTRACTION     Family History  Problem Relation Age of Onset   Anxiety disorder Mother    Arthritis Mother    Asthma Mother    COPD Mother    Depression Mother    Heart disease Mother    Hypertension Mother    Constipation Mother        Colostomy bag due to constipation   Colon cancer Father    Diabetes Sister    Irritable bowel syndrome Sister    Hypertension Sister    Diabetes Maternal Grandmother    Arthritis Maternal Grandmother    Asthma Maternal Grandmother    COPD Maternal Grandmother    Heart disease Maternal Grandmother    COPD Paternal Grandfather    Stroke Neg Hx    Social History   Tobacco Use   Smoking status: Never   Smokeless tobacco: Never  Vaping  Use   Vaping Use: Never used  Substance Use Topics   Alcohol use: Not Currently    Comment: rare   Drug use: No   Current Outpatient Medications  Medication Sig Dispense Refill   loratadine (CLARITIN) 10 MG tablet Take 10 mg by mouth daily.     polyethylene glycol powder (GLYCOLAX/MIRALAX) 17 GM/SCOOP powder Take 17 g by mouth daily. 3350 g 1   No current facility-administered medications for this visit.   No Known Allergies   Review of Systems: All systems reviewed and negative except where noted in HPI.   Physical Exam: BP 100/60 (BP Location: Left Arm, Patient Position: Sitting, Cuff Size: Normal)   Pulse 72   Ht 5' 2"$  (1.575 m)   Wt 139 lb (63 kg)   LMP 01/08/2023   BMI 25.42 kg/m  Constitutional: Pleasant,well-developed, female in no acute distress. HEENT: Normocephalic and atraumatic. Conjunctivae are normal. No scleral icterus. Cardiovascular: Normal rate, regular rhythm.  Pulmonary/chest: Effort normal and breath sounds normal. No wheezing, rales or rhonchi. Abdominal: Soft, nondistended, nontender. Bowel sounds quiet. There are no masses palpable. No hepatomegaly. Extremities: No edema Neurological: Alert and oriented to person place and time. Skin: Skin is warm and dry. No rashes noted. Psychiatric: Normal mood and affect. Behavior is normal.  Labs 10/2022: CBC and CMP unremarkable.  KUB 12/15/19: IMPRESSION: 1. Nonobstructive bowel gas pattern. 2. Small calcifications in the patient's pelvis, favored to represent phleboliths. If there is clinical concern for distal ureteral stone consider further evaluation with CT.  ASSESSMENT AND PLAN: Colon cancer screening Family history of colon cancer Constipation Hemorrhoids Patient presents to discuss colon cancer screening. She does have strong family history of colon cancer with her father being diagnosed at age 72 so will get her set up for a colonoscopy procedure. Will have her drink more water and take a daily  fiber supplement to see if this helps with her constipation. For her colonoscopy, will plan for a two day prep due to her underlying constipation. During her colonoscopy, will plan to assess her hemorrhoids to decide if she would be a candidate for hemorrhoidal banding versus hemorrhoid surgery.  - Drink 8 cups of water per day - Start daily fiber supplement - Cont Miralax QD - Colonoscopy [facility] with 2-day prep  Christia Reading, MD  I spent 45 minutes of time, including in depth chart review, independent review of results as outlined above, communicating results with the patient directly, face-to-face time with the patient, coordinating care, ordering studies and medications as appropriate, and documentation.

## 2023-02-01 ENCOUNTER — Ambulatory Visit: Payer: 59 | Admitting: Internal Medicine

## 2023-03-21 ENCOUNTER — Telehealth: Payer: Self-pay

## 2023-03-21 ENCOUNTER — Telehealth: Payer: Self-pay | Admitting: Internal Medicine

## 2023-03-21 NOTE — Telephone Encounter (Signed)
Inbound cal from patient needing to be further advised on prep instruction.  Please advise

## 2023-03-21 NOTE — Telephone Encounter (Signed)
I called patient to discuss prep no answer left message on voice mail to call office.

## 2023-03-22 NOTE — Telephone Encounter (Signed)
Patient called returning phone call. Requesting call back today. Please advise.

## 2023-03-25 ENCOUNTER — Telehealth: Payer: Self-pay

## 2023-03-25 NOTE — Telephone Encounter (Signed)
Spoke to patient answered all questions regarding prep for procedure.

## 2023-03-26 ENCOUNTER — Encounter: Payer: Self-pay | Admitting: Internal Medicine

## 2023-03-26 ENCOUNTER — Telehealth: Payer: 59

## 2023-03-26 ENCOUNTER — Ambulatory Visit (AMBULATORY_SURGERY_CENTER): Payer: 59 | Admitting: Internal Medicine

## 2023-03-26 VITALS — BP 118/59 | HR 59 | Temp 98.9°F | Resp 12 | Ht 62.0 in | Wt 139.0 lb

## 2023-03-26 DIAGNOSIS — Z1211 Encounter for screening for malignant neoplasm of colon: Secondary | ICD-10-CM

## 2023-03-26 DIAGNOSIS — D122 Benign neoplasm of ascending colon: Secondary | ICD-10-CM | POA: Diagnosis not present

## 2023-03-26 DIAGNOSIS — D120 Benign neoplasm of cecum: Secondary | ICD-10-CM | POA: Diagnosis not present

## 2023-03-26 DIAGNOSIS — D125 Benign neoplasm of sigmoid colon: Secondary | ICD-10-CM | POA: Diagnosis not present

## 2023-03-26 MED ORDER — SODIUM CHLORIDE 0.9 % IV SOLN: 500.0000 mL | Freq: Once | INTRAVENOUS | Status: DC

## 2023-03-26 NOTE — Progress Notes (Signed)
GASTROENTEROLOGY PROCEDURE H&P NOTE   Primary Care Physician:Park Meoer S, FNP    Reason for Procedure:   Colon cancer screening, family history of colon cancer  Plan:    Colonoscopy  Patient is appropriate for endoscopic procedure(s) in the ambulatory ([facility]) setting.  The nature of the procedure, as well as the risks, benefits, and alternatives were carefully and thoroughly reviewed with the patient. Ample time for discussion and questions allowed. The patient understood, was satisfied, and agreed to proceed.     HPI: Cheyenne Bolton is a 47 y.o. female who presents for colonoscopy for evaluation of colon cancer screening and family history of colon cancer.  Patient was most recently seen in the Gastroenterology Clinic on 01/24/23.  No interval change in medical history since that appointment. Please refer to that note for full details regarding GI history and clinical presentation.   Past Medical History:  Diagnosis Date   Depression     Past Surgical History:  Procedure Laterality Date   WISDOM TOOTH EXTRACTION      Prior to Admission medications   Medication Sig Start Date End Date Taking? Authorizing Provider  cetirizine (ZYRTEC) 10 MG tablet Take 10 mg by mouth daily.   Yes [provider]    Current Outpatient Medications  Medication Sig Dispense Refill   cetirizine (ZYRTEC) 10 MG tablet Take 10 mg by mouth daily.     Current Facility-Administered Medications  Medication Dose Route Frequency Provider Last Rate Last Admin   0.9 %  sodium chloride infusion  500 mL Intravenous Once Imogene Burn, MD        Allergies as of 03/26/2023   (No Known Allergies)    Family History  Problem Relation Age of Onset   Anxiety disorder Mother    Arthritis Mother    Asthma Mother    COPD Mother    Depression Mother    Heart disease Mother    Hypertension Mother    Constipation Mother        Colostomy bag due to constipation   Colon cancer Father     Diabetes Sister    Irritable bowel syndrome Sister    Hypertension Sister    Diabetes Maternal Grandmother    Arthritis Maternal Grandmother    Asthma Maternal Grandmother    COPD Maternal Grandmother    Heart disease Maternal Grandmother    COPD Paternal Grandfather    Stroke Neg Hx     Social History   Socioeconomic History   Marital status: Divorced    Spouse name: Not on file   Number of children: 3   Years of education: Not on file   Highest education level: Not on file  Occupational History   Occupation: delivery specialist  Tobacco Use   Smoking status: Never   Smokeless tobacco: Never  Vaping Use   Vaping Use: Never used  Substance and Sexual Activity   Alcohol use: Not Currently    Comment: rare   Drug use: No   Sexual activity: Yes    Birth control/protection: None  Other Topics Concern   Not on file  Social History Narrative   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  Works as as a Media planner.     Social Determinants of Health   Financial Resource Strain: Low Risk  (04/23/2018)   Overall Financial Resource Strain (CARDIA)    Difficulty of Paying Living Expenses: Not hard at all  Food Insecurity: No Food Insecurity (04/23/2018)   Hunger Vital Sign  Worried About Programme researcher, broadcasting/film/video in the Last Year: Never true    Ran Out of Food in the Last Year: Never true  Transportation Needs: No Transportation Needs (04/23/2018)   PRAPARE - Administrator, Civil Service (Medical): No    Lack of Transportation (Non-Medical): No  Physical Activity: Insufficiently Active (04/23/2018)   Exercise Vital Sign    Days of Exercise per Week: 3 days    Minutes of Exercise per Session: 20 min  Stress: Stress Concern Present (04/23/2018)   [employer] of Occupational Health - Occupational Stress Questionnaire    Feeling of Stress : To some extent  Social Connections: Somewhat Isolated (04/23/2018)   Social Connection and Isolation Panel [NHANES]    Frequency of  Communication with Friends and Family: More than three times a week    Frequency of Social Gatherings with Friends and Family: More than three times a week    Attends Religious Services: More than 4 times per year    Active Member of [employer] or Organizations: No    Attends Banker Meetings: Never    Marital Status: Separated  Intimate Partner Violence: Not At Risk (04/23/2018)   Humiliation, Afraid, Rape, and Kick questionnaire    Fear of Current or Ex-Partner: No    Emotionally Abused: No    Physically Abused: No    Sexually Abused: No    Physical Exam: Vital signs in last 24 hours: BP 126/74   Pulse (!) 57   Temp 98.9 F (37.2 C)   Ht  (1.575 m)   Wt 139 lb (63 kg)   LMP 03/26/2023 (Exact Date) Comment: no chance of pregnancy per pt  SpO2 100%   BMI 25.42 kg/m  GEN: NAD EYE: Sclerae anicteric ENT: MMM CV: Non-tachycardic Pulm: No increased WOB GI: Soft NEURO:  Alert & Oriented   Eulah Pont, MD [facility]   03/26/2023 10:21 AM

## 2023-03-26 NOTE — Telephone Encounter (Signed)
Office notes, procedure report and insurance with demographics faxed to [facility] requesting an appointment with Dr Cliffton Asters. Consultation for hemorrhoid surgery requested.

## 2023-03-26 NOTE — Progress Notes (Signed)
Called to room to assist during endoscopic procedure.  Patient ID and intended procedure confirmed with present staff. Received instructions for my participation in the procedure from the performing physician.  

## 2023-03-26 NOTE — Patient Instructions (Signed)
Handouts Provided:  Polyps  YOU HAD AN ENDOSCOPIC PROCEDURE TODAY AT THE [facility]:   Refer to the procedure report that was given to you for any specific questions about what was found during the examination.  If the procedure report does not answer your questions, please call your gastroenterologist to clarify.  If you requested that your care partner not be given the details of your procedure findings, then the procedure report has been included in a sealed envelope for you to review at your convenience later.  YOU SHOULD EXPECT: Some feelings of bloating in the abdomen. Passage of more gas than usual.  Walking can help get rid of the air that was put into your GI tract during the procedure and reduce the bloating. If you had a lower endoscopy (such as a colonoscopy or flexible sigmoidoscopy) you may notice spotting of blood in your stool or on the toilet paper. If you underwent a bowel prep for your procedure, you may not have a normal bowel movement for a few days.  Please Note:  You might notice some irritation and congestion in your nose or some drainage.  This is from the oxygen used during your procedure.  There is no need for concern and it should clear up in a day or so.  SYMPTOMS TO REPORT IMMEDIATELY:  Following lower endoscopy (colonoscopy or flexible sigmoidoscopy):  Excessive amounts of blood in the stool  Significant tenderness or worsening of abdominal pains  Swelling of the abdomen that is new, acute  Fever of 100F or higher  For urgent or emergent issues, a gastroenterologist can be reached at any hour by calling [phone]. Do not use MyChart messaging for urgent concerns.    DIET:  We do recommend a small meal at first, but then you may proceed to your regular diet.  Drink plenty of fluids but you should avoid alcoholic beverages for 24 hours.  ACTIVITY:  You should plan to take it easy for the rest of today and you should NOT DRIVE or use heavy  machinery until tomorrow (because of the sedation medicines used during the test).    FOLLOW UP: Our staff will call the number listed on your records the next business day following your procedure.  We will call around 7:15- 8:00 am to check on you and address any questions or concerns that you may have regarding the information given to you following your procedure. If we do not reach you, we will leave a message.     If any biopsies were taken you will be contacted by phone or by letter within the next 1-3 weeks.  Please call us at [phone] if you have not heard about the biopsies in 3 weeks.    SIGNATURES/CONFIDENTIALITY: You and/or your care partner have signed paperwork which will be entered into your electronic medical record.  These signatures attest to the fact that that the information above on your After Visit Summary has been reviewed and is understood.  Full responsibility of the confidentiality of this discharge information lies with you and/or your care-partner.  

## 2023-03-26 NOTE — Progress Notes (Signed)
Vss nad trans to pacu 

## 2023-03-26 NOTE — Op Note (Addendum)
[facility] Patient Name: Cheyenne Bolton Procedure Date: 03/26/2023 10:20 AM MRN: [id number] Endoscopist: Madelyn Brunner [name] , , [id number] Age: 47 Referring MD:  Date of Birth: January 06, 1976 Gender: Female Account #: [id number] Procedure:                Colonoscopy Indications:              Screening for colorectal malignant neoplasm Medicines:                Monitored Anesthesia Care Procedure:                Pre-Anesthesia Assessment:                           - Prior to the procedure, a History and Physical                            was performed, and patient medications and                            allergies were reviewed. The patient's tolerance of                            previous anesthesia was also reviewed. The risks                            and benefits of the procedure and the sedation                            options and risks were discussed with the patient.                            All questions were answered, and informed consent                            was obtained. Prior Anticoagulants: The patient has                            taken no anticoagulant or antiplatelet agents. ASA                            Grade Assessment: II - A patient with mild systemic                            disease. After reviewing the risks and benefits,                            the patient was deemed in satisfactory condition to                            undergo the procedure.                           After obtaining informed consent, the colonoscope  was passed under direct vision. Throughout the                            procedure, the patient's blood pressure, pulse, and                            oxygen saturations were monitored continuously. The                            [id number]-HQ190L Colonoscope [id number] was introduced                            through the anus and advanced to the the terminal                            ileum. The  colonoscopy was performed without                            difficulty. The patient tolerated the procedure                            well. The quality of the bowel preparation was                            excellent. The terminal ileum, ileocecal valve,                            appendiceal orifice, and rectum were photographed. Scope In: 10:32:31 AM Scope Out: 11:04:52 AM Scope Withdrawal Time: 0 hours 26 minutes 31 seconds  Total Procedure Duration: 0 hours 32 minutes 21 seconds  Findings:                 The terminal ileum appeared normal.                           Four sessile polyps were found in the ascending                            colon and cecum. The polyps were 3 to 6 mm in size.                            These polyps were removed with a cold snare.                            Resection and retrieval were complete.                           A 10 mm polyp was found in the sigmoid colon. The                            polyp was pedunculated. The polyp was removed with                            a  hot snare. Resection and retrieval were complete.                           Non-bleeding internal hemorrhoids were found during                            retroflexion. The hemorrhoids were Grade III/IV (a                            part of the hemorrhoids reduced manually but not                            fully) Complications:            No immediate complications. Estimated Blood Loss:     Estimated blood loss was minimal. Impression:               - The examined portion of the ileum was normal.                           - Four 3 to 6 mm polyps in the ascending colon and                            in the cecum, removed with a cold snare. Resected                            and retrieved.                           - One 10 mm polyp in the sigmoid colon, removed                            with a hot snare. Resected and retrieved.                           - Non-bleeding internal  hemorrhoids. Recommendation:           - Discharge patient to home (with escort).                           - Await pathology results.                           - Will refer to surgery for consideration of                            hemorrhoid surgery per patient preference.                           - The findings and recommendations were discussed                            with the patient. Dr Particia Lather "[name]" [name],  03/26/2023 11:09:30 AM

## 2023-03-26 NOTE — Progress Notes (Signed)
Pt's states no medical or surgical changes since previsit or office visit. 

## 2023-03-27 ENCOUNTER — Telehealth: Payer: Self-pay | Admitting: *Deleted

## 2023-03-27 NOTE — Telephone Encounter (Signed)
Inbound call from [facility], stated patient's insurance was OON, and they would need to be referred to another surgeon.

## 2023-03-27 NOTE — Telephone Encounter (Signed)
Called the patient and advised of [facility] being out of network. She will check with her insurance and get back to me with the names of surgeons within her insurance network.

## 2023-03-27 NOTE — Telephone Encounter (Signed)
  Follow up Call-     03/26/2023   10:03 AM  Call back number  Post procedure Call Back phone  # [phone]  Permission to leave phone message Yes     Patient questions:  Do you have a fever, pain , or abdominal swelling? No. Pain Score  0 *  Have you tolerated food without any problems? Yes.    Have you been able to return to your normal activities? Yes.    Do you have any questions about your discharge instructions: Diet   No. Medications  No. Follow up visit  No.  Do you have questions or concerns about your Care? No.  Actions: * If pain score is 4 or above: No action needed, pain <4.

## 2023-03-29 ENCOUNTER — Encounter: Payer: Self-pay | Admitting: Internal Medicine
